# Patient Record
Sex: Female | Born: 1990 | Race: White | Hispanic: No | Marital: Married | Smoking: Never smoker
Health system: Southern US, Community
[De-identification: ages and names within clinical notes are randomized; demographics above are authoritative.]

## PROBLEM LIST (undated history)

## (undated) ENCOUNTER — Inpatient Hospital Stay (HOSPITAL_COMMUNITY): Payer: Self-pay

## (undated) DIAGNOSIS — I10 Essential (primary) hypertension: Secondary | ICD-10-CM

## (undated) DIAGNOSIS — Z789 Other specified health status: Secondary | ICD-10-CM

## (undated) HISTORY — PX: HIP SURGERY: SHX245

---
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
## 2008-06-21 ENCOUNTER — Emergency Department (HOSPITAL_COMMUNITY): Admission: EM | Admit: 2008-06-21 | Discharge: 2008-06-21 | Payer: Self-pay | Admitting: Emergency Medicine

## 2008-08-21 ENCOUNTER — Emergency Department (HOSPITAL_COMMUNITY): Admission: EM | Admit: 2008-08-21 | Discharge: 2008-08-21 | Payer: Self-pay | Admitting: Emergency Medicine

## 2010-04-11 ENCOUNTER — Inpatient Hospital Stay (HOSPITAL_COMMUNITY)
Admission: AD | Admit: 2010-04-11 | Discharge: 2010-04-11 | Payer: Self-pay | Source: Home / Self Care | Attending: Obstetrics and Gynecology | Admitting: Obstetrics and Gynecology

## 2010-04-11 LAB — URINALYSIS, ROUTINE W REFLEX MICROSCOPIC
Bilirubin Urine: NEGATIVE
Hgb urine dipstick: NEGATIVE
Ketones, ur: NEGATIVE mg/dL
Nitrite: NEGATIVE
Protein, ur: NEGATIVE mg/dL
Specific Gravity, Urine: 1.02 (ref 1.005–1.030)
Urine Glucose, Fasting: NEGATIVE mg/dL
Urobilinogen, UA: 0.2 mg/dL (ref 0.0–1.0)
pH: 6.5 (ref 5.0–8.0)

## 2010-04-11 LAB — WET PREP, GENITAL
Clue Cells Wet Prep HPF POC: NONE SEEN
Trich, Wet Prep: NONE SEEN
Yeast Wet Prep HPF POC: NONE SEEN

## 2010-04-11 LAB — URINE MICROSCOPIC-ADD ON

## 2010-04-12 LAB — GC/CHLAMYDIA PROBE AMP, GENITAL
Chlamydia, DNA Probe: NEGATIVE
GC Probe Amp, Genital: NEGATIVE

## 2010-04-13 LAB — URINE CULTURE
Colony Count: 35000
Culture  Setup Time: 201201291151

## 2010-04-27 ENCOUNTER — Inpatient Hospital Stay (HOSPITAL_COMMUNITY)
Admission: AD | Admit: 2010-04-27 | Discharge: 2010-04-27 | Disposition: A | Discharge status: Home / Self Care | Payer: Medicaid Other | Source: Ambulatory Visit | Attending: Obstetrics and Gynecology | Admitting: Obstetrics and Gynecology

## 2010-04-27 DIAGNOSIS — O368190 Decreased fetal movements, unspecified trimester, not applicable or unspecified: Secondary | ICD-10-CM | POA: Insufficient documentation

## 2010-04-27 LAB — URINALYSIS, ROUTINE W REFLEX MICROSCOPIC
Nitrite: NEGATIVE
Protein, ur: NEGATIVE mg/dL
Urine Glucose, Fasting: NEGATIVE mg/dL
Urobilinogen, UA: 0.2 mg/dL (ref 0.0–1.0)

## 2010-04-27 LAB — URINE MICROSCOPIC-ADD ON

## 2010-05-12 ENCOUNTER — Inpatient Hospital Stay (HOSPITAL_COMMUNITY)
Admission: AD | Admit: 2010-05-12 | Discharge: 2010-05-20 | DRG: 774 | Disposition: A | Discharge status: Home / Self Care | Payer: Medicaid Other | Source: Ambulatory Visit | Attending: Obstetrics and Gynecology | Admitting: Obstetrics and Gynecology

## 2010-05-12 DIAGNOSIS — O1414 Severe pre-eclampsia complicating childbirth: Principal | ICD-10-CM | POA: Diagnosis present

## 2010-05-12 LAB — URIC ACID: Uric Acid, Serum: 6.5 mg/dL (ref 2.4–7.0)

## 2010-05-12 LAB — COMPREHENSIVE METABOLIC PANEL
BUN: 11 mg/dL (ref 6–23)
CO2: 20 mEq/L (ref 19–32)
Calcium: 8.6 mg/dL (ref 8.4–10.5)
GFR calc non Af Amer: >60 mL/min (ref >60)
Glucose, Bld: 75 mg/dL (ref 70–99)
Total Protein: 5.7 g/dL — ABNORMAL LOW (ref 6.0–8.3)

## 2010-05-12 LAB — CBC
HCT: 39.7 % (ref 36.0–46.0)
MCH: 29.3 pg (ref 26.0–34.0)
MCHC: 34 g/dL (ref 30.0–36.0)
MCV: 86.3 fL (ref 78.0–100.0)
RDW: 13.2 % (ref 11.5–15.5)

## 2010-05-12 LAB — URINALYSIS, ROUTINE W REFLEX MICROSCOPIC
Nitrite: NEGATIVE
Protein, ur: >300 mg/dL — AB
Specific Gravity, Urine: >1.03 — ABNORMAL HIGH (ref 1.005–1.030)
Urobilinogen, UA: 1 mg/dL (ref 0.0–1.0)

## 2010-05-12 LAB — LACTATE DEHYDROGENASE: LDH: 201 U/L (ref 94–250)

## 2010-05-12 LAB — URINE MICROSCOPIC-ADD ON

## 2010-05-13 ENCOUNTER — Inpatient Hospital Stay (HOSPITAL_COMMUNITY): Payer: Medicaid Other

## 2010-05-13 LAB — COMPREHENSIVE METABOLIC PANEL
AST: 24 U/L (ref 0–37)
Albumin: 1.9 g/dL — ABNORMAL LOW (ref 3.5–5.2)
Calcium: 8.8 mg/dL (ref 8.4–10.5)
Creatinine, Ser: 0.87 mg/dL (ref 0.4–1.2)
GFR calc Af Amer: >60 mL/min (ref >60)

## 2010-05-13 LAB — CREATININE CLEARANCE, URINE, 24 HOUR
Collection Interval-CRCL: 24 hours
Creatinine Clearance: 66 mL/min — ABNORMAL LOW (ref 75–115)
Creatinine, 24H Ur: 821 mg/d (ref 700–1800)
Creatinine, Urine: 364.8 mg/dL
Creatinine: 0.87 mg/dL (ref 0.4–1.2)

## 2010-05-13 LAB — CBC
HCT: 36.7 % (ref 36.0–46.0)
Hemoglobin: 12.2 g/dL (ref 12.0–15.0)
MCHC: 33.2 g/dL (ref 30.0–36.0)
MCV: 86.6 fL (ref 78.0–100.0)
Platelets: 153 10*3/uL (ref 150–400)
WBC: 15 10*3/uL — ABNORMAL HIGH (ref 4.0–10.5)

## 2010-05-14 LAB — CBC
HCT: 36.6 % (ref 36.0–46.0)
Hemoglobin: 12.4 g/dL (ref 12.0–15.0)
Hemoglobin: 12.6 g/dL (ref 12.0–15.0)
MCH: 29.2 pg (ref 26.0–34.0)
MCHC: 33.9 g/dL (ref 30.0–36.0)
MCHC: 33.3 g/dL (ref 30.0–36.0)
Platelets: 170 10*3/uL (ref 150–400)
RDW: 13.3 % (ref 11.5–15.5)

## 2010-05-14 LAB — COMPREHENSIVE METABOLIC PANEL
ALT: 16 U/L (ref 0–35)
ALT: 19 U/L (ref 0–35)
AST: 31 U/L (ref 0–37)
Albumin: 2 g/dL — ABNORMAL LOW (ref 3.5–5.2)
CO2: 20 mEq/L (ref 19–32)
CO2: 19 mEq/L (ref 19–32)
Calcium: 8.8 mg/dL (ref 8.4–10.5)
Calcium: 8.2 mg/dL — ABNORMAL LOW (ref 8.4–10.5)
Creatinine, Ser: 0.91 mg/dL (ref 0.4–1.2)
GFR calc Af Amer: >60 mL/min (ref >60)
GFR calc non Af Amer: >60 mL/min (ref >60)
Glucose, Bld: 132 mg/dL — ABNORMAL HIGH (ref 70–99)
Sodium: 136 mEq/L (ref 135–145)
Sodium: 137 mEq/L (ref 135–145)
Total Protein: 4.8 g/dL — ABNORMAL LOW (ref 6.0–8.3)

## 2010-05-14 LAB — URIC ACID: Uric Acid, Serum: 7.3 mg/dL — ABNORMAL HIGH (ref 2.4–7.0)

## 2010-05-15 ENCOUNTER — Inpatient Hospital Stay (HOSPITAL_COMMUNITY): Payer: Medicaid Other

## 2010-05-15 ENCOUNTER — Encounter (HOSPITAL_COMMUNITY): Payer: Self-pay | Admitting: Radiology

## 2010-05-15 LAB — CBC
HCT: 35.5 % — ABNORMAL LOW (ref 36.0–46.0)
HCT: 35.5 % — ABNORMAL LOW (ref 36.0–46.0)
MCH: 28.5 pg (ref 26.0–34.0)
MCHC: 32.7 g/dL (ref 30.0–36.0)
MCV: 87.2 fL (ref 78.0–100.0)
MCV: 86.8 fL (ref 78.0–100.0)
Platelets: 140 10*3/uL — ABNORMAL LOW (ref 150–400)
RBC: 4.09 MIL/uL (ref 3.87–5.11)
RDW: 13.5 % (ref 11.5–15.5)
WBC: 18 10*3/uL — ABNORMAL HIGH (ref 4.0–10.5)

## 2010-05-15 LAB — COMPREHENSIVE METABOLIC PANEL
BUN: 17 mg/dL (ref 6–23)
CO2: 22 mEq/L (ref 19–32)
Calcium: 7.8 mg/dL — ABNORMAL LOW (ref 8.4–10.5)
GFR calc non Af Amer: >60 mL/min (ref >60)
Glucose, Bld: 90 mg/dL (ref 70–99)
Total Protein: 4.5 g/dL — ABNORMAL LOW (ref 6.0–8.3)

## 2010-05-16 LAB — COMPREHENSIVE METABOLIC PANEL
ALT: 17 U/L (ref 0–35)
AST: 25 U/L (ref 0–37)
Albumin: 1.6 g/dL — ABNORMAL LOW (ref 3.5–5.2)
Alkaline Phosphatase: 97 U/L (ref 39–117)
Calcium: 7.9 mg/dL — ABNORMAL LOW (ref 8.4–10.5)
GFR calc Af Amer: >60 mL/min (ref >60)
Potassium: 4.2 mEq/L (ref 3.5–5.1)
Sodium: 137 mEq/L (ref 135–145)
Total Protein: 4.2 g/dL — ABNORMAL LOW (ref 6.0–8.3)

## 2010-05-16 LAB — TYPE AND SCREEN: Weak D: POSITIVE

## 2010-05-16 LAB — CBC
HCT: 36.3 % (ref 36.0–46.0)
Hemoglobin: 12.2 g/dL (ref 12.0–15.0)
MCH: 28.6 pg (ref 26.0–34.0)
MCHC: 33.6 g/dL (ref 30.0–36.0)
RBC: 4.27 MIL/uL (ref 3.87–5.11)

## 2010-05-17 LAB — CBC
HCT: 37.4 % (ref 36.0–46.0)
HCT: 39.1 % (ref 36.0–46.0)
HCT: 41.5 % (ref 36.0–46.0)
Hemoglobin: 12.4 g/dL (ref 12.0–15.0)
MCHC: 33.2 g/dL (ref 30.0–36.0)
MCHC: 33.2 g/dL (ref 30.0–36.0)
MCV: 86.8 fL (ref 78.0–100.0)
Platelets: 162 10*3/uL (ref 150–400)
RBC: 4.31 MIL/uL (ref 3.87–5.11)
RBC: 4.78 MIL/uL (ref 3.87–5.11)
RDW: 13.4 % (ref 11.5–15.5)
WBC: 13.8 10*3/uL — ABNORMAL HIGH (ref 4.0–10.5)
WBC: 17.9 10*3/uL — ABNORMAL HIGH (ref 4.0–10.5)

## 2010-05-17 LAB — COMPREHENSIVE METABOLIC PANEL
ALT: 15 U/L (ref 0–35)
ALT: 15 U/L (ref 0–35)
AST: 22 U/L (ref 0–37)
Albumin: 1.8 g/dL — ABNORMAL LOW (ref 3.5–5.2)
Alkaline Phosphatase: 91 U/L (ref 39–117)
Alkaline Phosphatase: 108 U/L (ref 39–117)
BUN: 14 mg/dL (ref 6–23)
BUN: 12 mg/dL (ref 6–23)
CO2: 22 mEq/L (ref 19–32)
Calcium: 8 mg/dL — ABNORMAL LOW (ref 8.4–10.5)
Calcium: 8.2 mg/dL — ABNORMAL LOW (ref 8.4–10.5)
Chloride: 111 mEq/L (ref 96–112)
Chloride: 102 mEq/L (ref 96–112)
GFR calc Af Amer: >60 mL/min (ref >60)
GFR calc non Af Amer: >60 mL/min (ref >60)
GFR calc non Af Amer: >60 mL/min (ref >60)
Glucose, Bld: 88 mg/dL (ref 70–99)
Glucose, Bld: 99 mg/dL (ref 70–99)
Potassium: 4.4 mEq/L (ref 3.5–5.1)
Potassium: 4.6 mEq/L (ref 3.5–5.1)
Sodium: 139 mEq/L (ref 135–145)
Sodium: 137 mEq/L (ref 135–145)
Total Bilirubin: 0.6 mg/dL (ref 0.3–1.2)
Total Protein: 5.1 g/dL — ABNORMAL LOW (ref 6.0–8.3)

## 2010-05-18 ENCOUNTER — Other Ambulatory Visit: Payer: Self-pay | Admitting: Obstetrics and Gynecology

## 2010-05-18 LAB — COMPREHENSIVE METABOLIC PANEL
AST: 31 U/L (ref 0–37)
Albumin: 1.2 g/dL — ABNORMAL LOW (ref 3.5–5.2)
Chloride: 105 mEq/L (ref 96–112)
Creatinine, Ser: 0.91 mg/dL (ref 0.4–1.2)
GFR calc Af Amer: >60 mL/min (ref >60)
Potassium: 4.5 mEq/L (ref 3.5–5.1)
Total Bilirubin: 0.2 mg/dL — ABNORMAL LOW (ref 0.3–1.2)

## 2010-05-18 LAB — CBC
MCH: 28.2 pg (ref 26.0–34.0)
MCH: 29.2 pg (ref 26.0–34.0)
MCV: 86.5 fL (ref 78.0–100.0)
Platelets: 169 10*3/uL (ref 150–400)
Platelets: 170 10*3/uL (ref 150–400)
RBC: 2.94 MIL/uL — ABNORMAL LOW (ref 3.87–5.11)
RBC: 2.67 MIL/uL — ABNORMAL LOW (ref 3.87–5.11)
WBC: 27.6 10*3/uL — ABNORMAL HIGH (ref 4.0–10.5)

## 2010-05-19 LAB — COMPREHENSIVE METABOLIC PANEL
BUN: 17 mg/dL (ref 6–23)
CO2: 23 mEq/L (ref 19–32)
Calcium: 6.7 mg/dL — ABNORMAL LOW (ref 8.4–10.5)
Creatinine, Ser: 0.95 mg/dL (ref 0.4–1.2)
GFR calc Af Amer: >60 mL/min (ref >60)
GFR calc non Af Amer: >60 mL/min (ref >60)
Glucose, Bld: 98 mg/dL (ref 70–99)

## 2010-05-19 LAB — CBC
HCT: 19.9 % — ABNORMAL LOW (ref 36.0–46.0)
Hemoglobin: 6.6 g/dL — CL (ref 12.0–15.0)
MCH: 28.6 pg (ref 26.0–34.0)
MCHC: 33.2 g/dL (ref 30.0–36.0)
MCV: 86.1 fL (ref 78.0–100.0)

## 2010-05-19 LAB — MAGNESIUM: Magnesium: 6.7 mg/dL (ref 1.5–2.5)

## 2010-05-20 LAB — DIFFERENTIAL
Basophils Absolute: 0.1 10*3/uL (ref 0.0–0.1)
Basophils Relative: 0 % (ref 0–1)
Lymphocytes Relative: 11 % — ABNORMAL LOW (ref 12–46)
Neutro Abs: 15.8 10*3/uL — ABNORMAL HIGH (ref 1.7–7.7)
Neutrophils Relative %: 80 % — ABNORMAL HIGH (ref 43–77)

## 2010-05-20 LAB — CBC
HCT: 21.9 % — ABNORMAL LOW (ref 36.0–46.0)
Hemoglobin: 7 g/dL — ABNORMAL LOW (ref 12.0–15.0)
RDW: 14.5 % (ref 11.5–15.5)
WBC: 19.7 10*3/uL — ABNORMAL HIGH (ref 4.0–10.5)

## 2010-05-25 NOTE — Discharge Summary (Signed)
Misty Zuniga, Misty Zuniga              ACCOUNT NO.:  [id number]  MEDICAL RECORD NO.:  [id number]           PATIENT TYPE:  I  LOCATION:  9319                          FACILITY:  [facility]  PHYSICIAN:  Sherron Monday, MD        DATE OF BIRTH:  22-Mar-1990  DATE OF ADMISSION:  05/12/2010 DATE OF DISCHARGE:  05/20/2010                              DISCHARGE SUMMARY   ADMITTING DIAGNOSIS:  Intrauterine pregnancy at 31 plus weeks with elevated blood pressure and proteinuria.  DISCHARGE DIAGNOSIS:  Status post vaginal delivery and severe preeclampsia.  HISTORY OF PRESENT ILLNESS:  This is a 20 year old, G1, P0 at 22 and 5 with an EDC on July 08, 2010 by LMP consistent with an 8-week ultrasound presented from the office with elevated blood pressures 150s/110 and 3+ proteinuria for further evaluation.  She denies headaches, some nausea and vomiting.  She admits pitting edema. Prenatal care has been otherwise uneventful.  PAST MEDICAL HISTORY:  Significant for ADHD.  PAST SURGICAL HISTORY:  Significant for hip repair in 2002.  PAST OB/GYN HISTORY:  G1 with the present pregnancy.  MEDICATIONS:  Prenatal vitamins.  ALLERGIES:  No known drug allergies.  SOCIAL HISTORY:  Denies alcohol, tobacco, or drug use.  FAMILY HISTORY:  Per her records.  PRENATAL LABORATORY DATA:  O positive.  Antibody screen negative. Rubella equivocal.  RPR nonreactive.  Hepatitis B surface antigen negative.  HIV negative.  Gonorrhea negative.  Chlamydia negative.  Quad screen within normal limits.  Cystic fibrosis screen normal.  Glucola 141, 3-hour of 79, 175, 145, and 96.  On admission, her pressures were elevated, otherwise benign exam.  Had a reactive NST, received steroids, started on 24-hour urine and close observation.  Her platelets were within normal limits as were her LFTs.  Initially, she was started on labetalol, this was discontinued as her blood pressures with bed rest had resolved some on their own.  She  continued through the weekend with mild symptoms and close monitoring of these symptoms.  On the morning of Monday, May 17, 2010, she had a mild-to-moderate headache and had been treated for life.  She also complained of scotomata.  She was transferred to L and D for induction of labor.  She progressed in labor with receiving magnesium for seizure prophylaxis.  AROM was performed. NICU was aware of her labor process.  She had received an ultrasound which revealed an estimated fetal weight of 4 pounds 12 ounces.  She progressed, anterior lip complete, +2 to 3, pushed for 5-10 minutes at the cervical lip and then pushed for an hour to deliver a viable female infant at 12:38 on the morning of May 18, 2010 with Apgars of 8 at 1 minute and 9 at 5 minutes with a weight of 4 pounds and 12 ounces.  The patient's vulva was very swollen and blisters from edema noted. Placenta was expressed intact, sent to pathology.  She had a secondary perineal laceration, this was repaired with 2-0 Vicryl to reinforce the rectal sphincter and 3-0 Vicryl Rapide.  EBL was less than 500 mL. Infant delivered through the nuchal cord.  Her postpartum course was relatively uncomplicated.  She was maintained on magnesium for approximately 24 hours.  Her labs were also closely monitored.  She was again treated for life.  Her hemoglobin was noted to be stable on postpartum day #2 at 7 and started at 14 and was stable following delivery and being followed every day.  On the day of discharge, she was ambulating, voiding, and tolerating a diet.  Her pain was well controlled.  She had normal lochia.  Discharge instructions were reviewed with her including reasons to call with any questions or problems.  She was given prescriptions for Motrin, Vicodin, prenatal vitamins, and iron.  She will follow up in the office in approximately a week for a blood pressure check.  DISCHARGE INFORMATION:  She is B+, rubella equivocal,  received RPR and MMR prior to discharge.  She plans both breast and bottle feed.  An IUD will be placed.  Hemoglobin decreased from 14 to 8.3 to 7.8 to 6.6 to 7.     Sherron Monday, MD     JB/MEDQ  D:  05/20/2010  T:  05/20/2010  Job:  [id number]  Electronically Signed by Sherron Monday MD on 05/25/2010 01:49:28 PM

## 2010-06-21 LAB — RAPID STREP SCREEN (MED CTR MEBANE ONLY): Streptococcus, Group A Screen (Direct): POSITIVE — AB

## 2010-08-24 ENCOUNTER — Emergency Department (HOSPITAL_COMMUNITY)
Admission: EM | Admit: 2010-08-24 | Discharge: 2010-08-24 | Disposition: A | Discharge status: Home / Self Care | Payer: Medicaid Other | Attending: Emergency Medicine | Admitting: Emergency Medicine

## 2010-08-24 ENCOUNTER — Emergency Department (HOSPITAL_COMMUNITY): Payer: Medicaid Other

## 2010-08-24 DIAGNOSIS — F988 Other specified behavioral and emotional disorders with onset usually occurring in childhood and adolescence: Secondary | ICD-10-CM | POA: Insufficient documentation

## 2010-08-24 DIAGNOSIS — M79609 Pain in unspecified limb: Secondary | ICD-10-CM | POA: Insufficient documentation

## 2010-08-24 DIAGNOSIS — M25559 Pain in unspecified hip: Secondary | ICD-10-CM | POA: Insufficient documentation

## 2010-08-24 LAB — POCT PREGNANCY, URINE: Preg Test, Ur: NEGATIVE

## 2010-09-19 ENCOUNTER — Emergency Department (HOSPITAL_COMMUNITY)
Admission: EM | Admit: 2010-09-19 | Discharge: 2010-09-19 | Disposition: A | Discharge status: Home / Self Care | Payer: Medicaid Other | Attending: Emergency Medicine | Admitting: Emergency Medicine

## 2010-09-19 DIAGNOSIS — F988 Other specified behavioral and emotional disorders with onset usually occurring in childhood and adolescence: Secondary | ICD-10-CM | POA: Insufficient documentation

## 2010-09-19 DIAGNOSIS — M25559 Pain in unspecified hip: Secondary | ICD-10-CM | POA: Insufficient documentation

## 2013-02-20 LAB — OB RESULTS CONSOLE GC/CHLAMYDIA
Chlamydia: NEGATIVE
Gonorrhea: NEGATIVE

## 2013-02-20 LAB — OB RESULTS CONSOLE RUBELLA ANTIBODY, IGM: Rubella: NON-IMMUNE/NOT IMMUNE

## 2013-02-20 LAB — OB RESULTS CONSOLE ABO/RH: RH TYPE: POSITIVE

## 2013-02-20 LAB — OB RESULTS CONSOLE ANTIBODY SCREEN: Antibody Screen: NEGATIVE

## 2013-02-20 LAB — OB RESULTS CONSOLE RPR: RPR: NONREACTIVE

## 2013-02-20 LAB — OB RESULTS CONSOLE HIV ANTIBODY (ROUTINE TESTING): HIV: NONREACTIVE

## 2013-02-20 LAB — OB RESULTS CONSOLE HEPATITIS B SURFACE ANTIGEN: Hepatitis B Surface Ag: NEGATIVE

## 2013-03-14 NOTE — L&D Delivery Note (Signed)
Delivery Note At 8:36 AM a viable female was delivered via  (Presentation: OA ).  APGAR: P; weight 3 lb (1361 g).   Placenta status: delivered, manually extracted.  Cord:  3VC with the following complications: nuchal.    Anesthesia:  epidural Episiotomy: none Lacerations: none Suture Repair: N/A Est. Blood Loss (mL): 400  Mom to postpartum.  Baby to NICU.  Misty Zuniga 07/15/2013, 9:04 AM  Br/B+/IUD PP/RNI  Pt declines circ for female baby

## 2013-06-09 ENCOUNTER — Encounter (HOSPITAL_COMMUNITY): Payer: Self-pay | Admitting: *Deleted

## 2013-06-09 ENCOUNTER — Inpatient Hospital Stay (HOSPITAL_COMMUNITY): Payer: PRIVATE HEALTH INSURANCE

## 2013-06-09 ENCOUNTER — Inpatient Hospital Stay (HOSPITAL_COMMUNITY)
Admission: AD | Admit: 2013-06-09 | Discharge: 2013-06-09 | Disposition: A | Discharge status: Home / Self Care | Payer: PRIVATE HEALTH INSURANCE | Source: Ambulatory Visit | Attending: Obstetrics and Gynecology | Admitting: Obstetrics and Gynecology

## 2013-06-09 DIAGNOSIS — O4700 False labor before 37 completed weeks of gestation, unspecified trimester: Secondary | ICD-10-CM | POA: Insufficient documentation

## 2013-06-09 DIAGNOSIS — R109 Unspecified abdominal pain: Secondary | ICD-10-CM | POA: Insufficient documentation

## 2013-06-09 HISTORY — DX: Other specified health status: Z78.9

## 2013-06-09 LAB — URINALYSIS, ROUTINE W REFLEX MICROSCOPIC
BILIRUBIN URINE: NEGATIVE
GLUCOSE, UA: NEGATIVE mg/dL
Ketones, ur: NEGATIVE mg/dL
Nitrite: NEGATIVE
PROTEIN: 30 mg/dL — AB
Specific Gravity, Urine: 1.025 (ref 1.005–1.030)
Urobilinogen, UA: 1 mg/dL (ref 0.0–1.0)
pH: 7.5 (ref 5.0–8.0)

## 2013-06-09 LAB — WET PREP, GENITAL
Clue Cells Wet Prep HPF POC: NONE SEEN
Trich, Wet Prep: NONE SEEN
Yeast Wet Prep HPF POC: NONE SEEN

## 2013-06-09 LAB — URINE MICROSCOPIC-ADD ON

## 2013-06-09 LAB — FETAL FIBRONECTIN: FETAL FIBRONECTIN: NEGATIVE

## 2013-06-09 MED ORDER — NIFEDIPINE 10 MG PO CAPS: 10.0000 mg | ORAL_CAPSULE | ORAL | Status: DC | PRN

## 2013-06-09 MED ORDER — NIFEDIPINE 10 MG PO CAPS
10.0000 mg | ORAL_CAPSULE | Freq: Once | ORAL | Status: AC
  Administered 2013-06-09: 10 mg via ORAL
  Filled 2013-06-09: qty 1

## 2013-06-09 NOTE — MAU Note (Signed)
23 yo, G2P1 at 5144w0d, presents to MAU with c/o intermittent, irregular contractions and cramping since last night; her son's birthday party was yesterday and she reports she was on her feet for several hours.  Reports +FM; denies VB, LOF.  History significant for preterm delivery at 32 weeks.

## 2013-06-09 NOTE — MAU Note (Signed)
Pt presents with complaints of lower abdominal cramping that started last night, she was able to go to sleep but cramping started back this morning. Denies any vaginal bleeding or LOF.

## 2013-06-09 NOTE — Progress Notes (Signed)
Patient denies vaginal bleeding.

## 2013-06-09 NOTE — Discharge Instructions (Signed)
Get your prescription from the pharmacy.  Take as directed. Follow up in the office in one week. Drink at least 8 8-oz glasses of water every day. Call your doctor if you continue to have contractions even after taking the procardia or if you are leaking or having vaginal bleeding. Pelvic rest - no sex, No tampons, no douching.

## 2013-06-09 NOTE — MAU Note (Signed)
Pt had preterm labor with first pregnancy and had baby at [redacted]weeks gestation

## 2013-06-09 NOTE — MAU Provider Note (Signed)
History     CSN: [id number]  Arrival date and time: 06/09/13 1430   First Provider Initiated Contact with Patient 06/09/13 1521      Chief Complaint  Patient presents with  . Abdominal Cramping   HPI Misty Zuniga 23 y.o. [redacted]w[redacted]d   Came in for uterine contractions.  Has history of preterm contractions at 27 weeks with last pregnancy.  Last pregnancy was a preterm vaginal delivery at 32 weeks due to preeclampsia.  Denies any leaking or bleeding.    OB History   Grav Para Term Preterm Abortions TAB SAB Ect Mult Living   1               Past Medical History  Diagnosis Date  . Medical history non-contributory     Past Surgical History  Procedure Laterality Date  . Hip surgery      legg calve perthes corrective surgery (L hip)    History reviewed. No pertinent family history.  History  Substance Use Topics  . Smoking status: Never Smoker   . Smokeless tobacco: Not on file  . Alcohol Use: No    Allergies: No Known Allergies  No prescriptions prior to admission    Review of Systems  Constitutional: Negative for fever.  Gastrointestinal: Positive for abdominal pain. Negative for nausea, vomiting, diarrhea and constipation.  Genitourinary:       No vaginal discharge. No vaginal bleeding. No dysuria.   Physical Exam   Blood pressure 141/80, pulse 99, temperature 98.4 F (36.9 C), temperature source Oral, resp. rate 18, height 4\' 10"  (1.473 m), weight 176 lb (79.833 kg), last menstrual period 11/25/2012.  Physical Exam  Nursing note and vitals reviewed. Constitutional: She is oriented to person, place, and time. She appears well-developed and well-nourished.  HENT:  Head: Normocephalic.  Eyes: EOM are normal.  Neck: Neck supple.  GI: Soft. There is no tenderness.  FHT baseline 150.  A few contractions seen at 4-6 minutes.  One Variable deceleration seen.    Genitourinary:  Speculum exam: Vagina - No blood seen on introduction of speculum into vagina.   Difficult to fully visualize cervix.  FFN done and bleeding noted Bimanual exam: Cervix closed and very soft GC/Chlam, wet prep done Chaperone present for exam.  Musculoskeletal: Normal range of motion.  Neurological: She is alert and oriented to person, place, and time.  Skin: Skin is warm and dry.  Psychiatric: She has a normal mood and affect.    MAU Course  Procedures Results for orders placed during the hospital encounter of 06/09/13 (from the past 24 hour(s))  URINALYSIS, ROUTINE W REFLEX MICROSCOPIC     Status: Abnormal   Collection Time    06/09/13  2:48 PM      Result Value Ref Range   Color, Urine YELLOW  YELLOW   APPearance CLOUDY (*) CLEAR   Specific Gravity, Urine 1.025  1.005 - 1.030   pH 7.5  5.0 - 8.0   Glucose, UA NEGATIVE  NEGATIVE mg/dL   Hgb urine dipstick SMALL (*) NEGATIVE   Bilirubin Urine NEGATIVE  NEGATIVE   Ketones, ur NEGATIVE  NEGATIVE mg/dL   Protein, ur 30 (*) NEGATIVE mg/dL   Urobilinogen, UA 1.0  0.0 - 1.0 mg/dL   Nitrite NEGATIVE  NEGATIVE   Leukocytes, UA TRACE (*) NEGATIVE  URINE MICROSCOPIC-ADD ON     Status: Abnormal   Collection Time    06/09/13  2:48 PM      Result Value Ref Range  Squamous Epithelial / LPF MANY (*) RARE   WBC, UA 0-2  <3 WBC/hpf   RBC / HPF 7-10  <3 RBC/hpf   Bacteria, UA FEW (*) RARE   Crystals CA OXALATE CRYSTALS (*) NEGATIVE   Urine-Other MUCOUS PRESENT    WET PREP, GENITAL     Status: Abnormal   Collection Time    06/09/13  3:42 PM      Result Value Ref Range   Yeast Wet Prep HPF POC NONE SEEN  NONE SEEN   Trich, Wet Prep NONE SEEN  NONE SEEN   Clue Cells Wet Prep HPF POC NONE SEEN  NONE SEEN   WBC, Wet Prep HPF POC TOO NUMEROUS TO COUNT (*) NONE SEEN  FETAL FIBRONECTIN     Status: None   Collection Time    06/09/13  3:42 PM      Result Value Ref Range   Fetal Fibronectin NEGATIVE  NEGATIVE   MDM Ultrasound reviewed - Cervical length 2.6 cm. Dr. Senaida Oresichardson notified of US results and orders  received.  Assessment and Plan  Threatened preterm labor  Plan Procardia 30 mg PO in MAU Rx Procardia 30 mg PO a 4 hours PRN for contractions (#30) no refills Follow up in the office in one week. Drink at least 8 8-oz glasses of water every day. Call your doctor if you continue to have contractions even after taking the procardia or if you are leaking or having vaginal bleeding. Pelvic rest - no sex, No tampons, no douching. Urine culture pending.  Ember Gottwald 06/09/2013, 3:23 PM

## 2013-06-10 LAB — GC/CHLAMYDIA PROBE AMP
CT PROBE, AMP APTIMA: NEGATIVE
GC Probe RNA: NEGATIVE

## 2013-06-11 ENCOUNTER — Inpatient Hospital Stay (HOSPITAL_COMMUNITY)
Admission: AD | Admit: 2013-06-11 | Discharge: 2013-06-11 | Disposition: A | Discharge status: Home / Self Care | Payer: PRIVATE HEALTH INSURANCE | Source: Ambulatory Visit | Attending: Obstetrics and Gynecology | Admitting: Obstetrics and Gynecology

## 2013-06-11 ENCOUNTER — Encounter (HOSPITAL_COMMUNITY): Payer: Self-pay | Admitting: *Deleted

## 2013-06-11 DIAGNOSIS — O4700 False labor before 37 completed weeks of gestation, unspecified trimester: Secondary | ICD-10-CM | POA: Insufficient documentation

## 2013-06-11 DIAGNOSIS — O479 False labor, unspecified: Secondary | ICD-10-CM

## 2013-06-11 LAB — URINE MICROSCOPIC-ADD ON

## 2013-06-11 LAB — URINALYSIS, ROUTINE W REFLEX MICROSCOPIC
Bilirubin Urine: NEGATIVE
Glucose, UA: NEGATIVE mg/dL
Ketones, ur: 40 mg/dL — AB
LEUKOCYTES UA: NEGATIVE
NITRITE: NEGATIVE
PROTEIN: NEGATIVE mg/dL
Specific Gravity, Urine: 1.01 (ref 1.005–1.030)
Urobilinogen, UA: 0.2 mg/dL (ref 0.0–1.0)
pH: 7.5 (ref 5.0–8.0)

## 2013-06-11 NOTE — MAU Provider Note (Signed)
History     CSN: [id number]  Arrival date and time: 06/11/13 [id number]   First Provider Initiated Contact with Patient 06/11/13 2022      Chief Complaint  Patient presents with  . Abdominal Cramping   HPI [name] is a 23 y.o. G2P0101 at 7244w2d who presents to MAU today with complaint of contractions. The patient was seen here on Sunday with contractions and started on Procardia PRN. She states that she noted contractions again q 5-10 minutes this evening. She took her Procardia and Progesterone at 1700 with minimal relief. She states that since arrival in MAU her contractions have become irregular. She noted moderate discomfort with contractions. She is also describing an increased in pelvic pressure. She denies vaginal bleeding, discharge or LOF. She reports good fetal movment. Cervix was closed, thick and soft on Sunday and FFN was negative then as well.   OB History   Grav Para Term Preterm Abortions TAB SAB Ect Mult Living   2 1  1      1       Past Medical History  Diagnosis Date  . Medical history non-contributory     Past Surgical History  Procedure Laterality Date  . 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 Diabetes Mother   . Diabetes Maternal Aunt     History  Substance Use Topics  . Smoking status: Never Smoker   . Smokeless tobacco: Not on file  . Alcohol Use: No    Allergies: No Known Allergies  Prescriptions prior to admission  Medication Sig Dispense Refill  . acetaminophen (TYLENOL) 325 MG tablet Take 650 mg by mouth every 6 (six) hours as needed for headache.      Marland Kitchen. aspirin 81 MG chewable tablet Chew 81 mg by mouth daily.      Marland Kitchen. NIFEdipine (PROCARDIA) 10 MG capsule Take 1 capsule (10 mg total) by mouth every 4 (four) hours as needed.  30 capsule  0  . Prenatal Vit-Fe Fumarate-FA (PRENATAL MULTIVITAMIN) TABS tablet Take 1 tablet by mouth daily at 12 noon.      .  progesterone (PROMETRIUM) 200 MG capsule Take 200 mg by mouth at bedtime.        Review of Systems  Constitutional: Negative for fever and malaise/fatigue.  Gastrointestinal: Negative for nausea, vomiting, abdominal pain, diarrhea and constipation.  Genitourinary:       Neg - vaginal bleeding, discharge, LOF   Physical Exam   Blood pressure 111/74, pulse 97, temperature 98.6 F (37 C), resp. rate 18, last menstrual period 11/25/2012.  Physical Exam  Constitutional: She is oriented to person, place, and time. She appears well-developed and well-nourished. No distress.  HENT:  Head: Normocephalic and atraumatic.  Cardiovascular: Normal rate, regular rhythm and normal heart sounds.   Respiratory: Effort normal and breath sounds normal. No respiratory distress.  GI: Soft. Bowel sounds are normal. She exhibits no distension and no mass. There is no tenderness. There is no rebound and no guarding.  Neurological: She is alert and oriented to person, place, and time.  Skin: Skin is warm and dry. No erythema.  Psychiatric: She has a normal mood and affect.  Dilation: 1 (1cm outer os and FT inner os) Effacement (%): 50 Exam by:: Naaman PlummerJ Ethier PA  Results for orders placed during the hospital encounter of 06/11/13 (from the past 24 hour(s))  URINALYSIS, ROUTINE W  REFLEX MICROSCOPIC     Status: Abnormal   Collection Time    06/11/13  7:35 PM      Result Value Ref Range   Color, Urine YELLOW  YELLOW   APPearance CLEAR  CLEAR   Specific Gravity, Urine 1.010  1.005 - 1.030   pH 7.5  5.0 - 8.0   Glucose, UA NEGATIVE  NEGATIVE mg/dL   Hgb urine dipstick TRACE (*) NEGATIVE   Bilirubin Urine NEGATIVE  NEGATIVE   Ketones, ur 40 (*) NEGATIVE mg/dL   Protein, ur NEGATIVE  NEGATIVE mg/dL   Urobilinogen, UA 0.2  0.0 - 1.0 mg/dL   Nitrite NEGATIVE  NEGATIVE   Leukocytes, UA NEGATIVE  NEGATIVE  URINE MICROSCOPIC-ADD ON     Status: Abnormal   Collection Time    06/11/13  7:35 PM      Result Value Ref  Range   Squamous Epithelial / LPF RARE  RARE   WBC, UA 0-2  <3 WBC/hpf   Bacteria, UA FEW (*) RARE   Urine-Other MUCOUS PRESENT      Fetal Monitoring: Baseline:140 bpm, moderate variability, +accelerations, no decelerations Contractions: occasional, mild UI MAU Course  Procedures None  MDM Discussed with Dr. Ambrose Mantle. Ok for discharge. Continue on current tocolytic therapy. Follow-up in the office as scheduled.   Assessment and Plan  A: Preterm contractions  P: Discharge home Patient advised to continue on current tocolytic regimen Preterm labor precautions discussed Patient to follow-up in the office as scheduled or sooner if symptoms worsen Patient may return to MAU as needed or if her condition were to change or worsen  Freddi Starr, PA-C  06/11/2013, 8:23 PM

## 2013-06-11 NOTE — Discharge Instructions (Signed)
Braxton Hicks Contractions Pregnancy is commonly associated with contractions of the uterus throughout the pregnancy. Towards the end of pregnancy (32 to 34 weeks), these contractions [facility](Braxton Willa RoughHicks) can develop more often and may become more forceful. This is not true labor because these contractions do not result in opening (dilatation) and thinning of the cervix. They are sometimes difficult to tell apart from true labor because these contractions can be forceful and people have different pain tolerances. You should not feel embarrassed if you go to the hospital with false labor. Sometimes, the only way to tell if you are in true labor is for your caregiver to follow the changes in the cervix. How to tell the difference between true and false labor:  False labor.  The contractions of false labor are usually shorter, irregular and not as hard as those of true labor.  They are often felt in the front of the lower abdomen and in the groin.  They may leave with walking around or changing positions while lying down.  They get weaker and are shorter lasting as time goes on.  These contractions are usually irregular.  They do not usually become progressively stronger, regular and closer together as with true labor.  True labor.  Contractions in true labor last 30 to 70 seconds, become very regular, usually become more intense, and increase in frequency.  They do not go away with walking.  The discomfort is usually felt in the top of the uterus and spreads to the lower abdomen and low back.  True labor can be determined by your caregiver with an exam. This will show that the cervix is dilating and getting thinner. If there are no prenatal problems or other health problems associated with the pregnancy, it is completely safe to be sent home with false labor and await the onset of true labor. HOME CARE INSTRUCTIONS   Keep up with your usual exercises and instructions.  Take medications as  directed.  Keep your regular prenatal appointment.  Eat and drink lightly if you think you are going into labor.  If BH contractions are making you uncomfortable:  Change your activity position from lying down or resting to walking/walking to resting.  Sit and rest in a tub of warm water.  Drink 2 to 3 glasses of water. Dehydration may cause B-H contractions.  Do slow and deep breathing several times an hour. SEEK IMMEDIATE MEDICAL CARE IF:   Your contractions continue to become stronger, more regular, and closer together.  You have a gushing, burst or leaking of fluid from the vagina.  An oral temperature above 102 F (38.9 C) develops.  You have passage of blood-tinged mucus.  You develop vaginal bleeding.  You develop continuous belly (abdominal) pain.  You have low back pain that you never had before.  You feel the baby's head pushing down causing pelvic pressure.  The baby is not moving as much as it used to. Document Released: 02/28/2005 Document Revised: 05/23/2011 Document Reviewed: 12/10/2012 [facility] Patient Information 2014 [place], [place]. Preterm Labor Information Preterm labor is when labor starts before you are [redacted] weeks pregnant. The normal length of pregnancy is 39 to 41 weeks.  CAUSES  The cause of preterm labor is not often known. The most common known cause is infection. RISK FACTORS  Having a history of preterm labor.  Having your water break before it should.  Having a placenta that covers the opening of the cervix.  Having a placenta that breaks away from  the uterus.  Having a cervix that is too weak to hold the baby in the uterus.  Having too much fluid in the amniotic sac.  Taking drugs or smoking while pregnant.  Not gaining enough weight while pregnant.  Being younger than 5118 and older than 25 years old.  Having a low income.  Being African American. SYMPTOMS  Period-like cramps, belly (abdominal) pain, or back  pain.  Contractions that are regular, as often as six in an hour. They may be mild or painful.  Contractions that start at the top of the belly. They then move to the lower belly and back.  Lower belly pressure that seems to get stronger.  Bleeding from the vagina.  Fluid leaking from the vagina. TREATMENT  Treatment depends on:  Your condition.  The condition of your baby.  How many weeks pregnant you are. Your doctor may have you:  Take medicine to stop contractions.  Stay in bed except to use the restroom (bed rest).  Stay in the hospital. WHAT SHOULD YOU DO IF YOU THINK YOU ARE IN PRETERM LABOR? Call your doctor right away. You need to go to the hospital right away.  HOW CAN YOU PREVENT PRETERM LABOR IN FUTURE PREGNANCIES?  Stop smoking, if you smoke.  Maintain healthy weight gain.  Do not take drugs or be around chemicals that are not needed.  Tell your doctor if you think you have an infection.  Tell your doctor if you had a preterm labor before. Document Released: 05/27/2008 Document Revised: 12/19/2012 Document Reviewed: 05/27/2008 [facility] Patient Information 2014 [place], [place].

## 2013-06-11 NOTE — MAU Note (Signed)
C/o cramping since 1630 today; pt has preterm labor meds at home; seen this past Sun. in MAU:

## 2013-06-19 ENCOUNTER — Encounter (HOSPITAL_COMMUNITY): Payer: Self-pay | Admitting: *Deleted

## 2013-06-19 ENCOUNTER — Inpatient Hospital Stay (HOSPITAL_COMMUNITY)
Admission: AD | Admit: 2013-06-19 | Discharge: 2013-06-19 | Disposition: A | Discharge status: Home / Self Care | Payer: PRIVATE HEALTH INSURANCE | Source: Ambulatory Visit | Attending: Obstetrics and Gynecology | Admitting: Obstetrics and Gynecology

## 2013-06-19 DIAGNOSIS — O368190 Decreased fetal movements, unspecified trimester, not applicable or unspecified: Secondary | ICD-10-CM | POA: Insufficient documentation

## 2013-06-19 DIAGNOSIS — O99891 Other specified diseases and conditions complicating pregnancy: Secondary | ICD-10-CM | POA: Insufficient documentation

## 2013-06-19 DIAGNOSIS — R109 Unspecified abdominal pain: Secondary | ICD-10-CM | POA: Insufficient documentation

## 2013-06-19 DIAGNOSIS — O9989 Other specified diseases and conditions complicating pregnancy, childbirth and the puerperium: Secondary | ICD-10-CM

## 2013-06-19 LAB — URINALYSIS, ROUTINE W REFLEX MICROSCOPIC
Bilirubin Urine: NEGATIVE
GLUCOSE, UA: NEGATIVE mg/dL
KETONES UR: NEGATIVE mg/dL
Leukocytes, UA: NEGATIVE
Nitrite: NEGATIVE
PH: 6.5 (ref 5.0–8.0)
Protein, ur: NEGATIVE mg/dL
Urobilinogen, UA: 0.2 mg/dL (ref 0.0–1.0)

## 2013-06-19 LAB — URINE MICROSCOPIC-ADD ON: WBC UA: NONE SEEN WBC/hpf (ref <3)

## 2013-06-19 NOTE — MAU Note (Signed)
Pt reports only feeling baby move about 3 times today.  Reports lower abdominal pain and pressure. Denies VB/LOF.

## 2013-06-19 NOTE — MAU Provider Note (Signed)
  History     CSN: [id number]  Arrival date and time: 06/19/13 [id number]   First Provider Initiated Contact with Patient 06/19/13 [phone]      Chief Complaint  Patient presents with  . Decreased Fetal Movement  . Contractions   HPI [name] is a 23 y.o. G2P0101 at 407w3d who presents to MAU today with complaint of decreased FM since this morning and contractions. The patient states that she has only noted few movements this morning and none since 1900. She states that she is also having occasional sharp pain in her pelvic area. She denies tightening of the abdomen. She also denies vaginal bleeding or LOF. Last intercourse was 2 days ago. She has noted a scant amount of brown discharge earlier today. Patient has had issues with PTL earlier in the pregnancy. She is on Procardia and vaginal Progesterone.   OB History   Grav Para Term Preterm Abortions TAB SAB Ect Mult Living   2 1  1      1       Past Medical History  Diagnosis Date  . Medical history non-contributory     Past Surgical History  Procedure Laterality Date  . 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 Diabetes Mother   . Diabetes Maternal Aunt     History  Substance Use Topics  . Smoking status: Never Smoker   . Smokeless tobacco: Not on file  . Alcohol Use: No    Allergies: No Known Allergies  No prescriptions prior to admission    Review of Systems  Constitutional: Negative for fever.  Gastrointestinal: Positive for abdominal pain. Negative for nausea and vomiting.  Genitourinary:       + vaginal bleeding, discharge Neg - LOF   Physical Exam   Blood pressure 119/75, pulse 86, temperature 97.6 F (36.4 C), temperature source Oral, resp. rate 18, last menstrual period 11/25/2012.  Physical Exam  Constitutional: She is oriented to person, place, and time. She appears well-developed and well-nourished. No distress.  HENT:   Head: Normocephalic and atraumatic.  Cardiovascular: Normal rate.   Respiratory: Effort normal.  GI: Soft. She exhibits no distension and no mass. There is no tenderness. There is no rebound and no guarding.  Neurological: She is alert and oriented to person, place, and time.  Skin: Skin is warm and dry. No erythema.  Psychiatric: She has a normal mood and affect.   Fetal Monitoring: Baseline: 125 bpm, moderate variability, + accelerations, no decelerations Contractions: none MAU Course  Procedures None  MDM Discussed with Dr. Ellyn HackBovard. Patient ok for discharge with PTL precautions. Follow-up in the office as scheduled  Assessment and Plan  A: Abdominal pain in pregnancy Reactive NST  P: Discharge home Patient advised to continue current medications Discussed PTL precautions Patient advised to follow-up in the office as scheduled for routine prenatal care Patient may return to MAU as needed or if her condition were to change or worsen  Freddi StarrJulie N Ethier, PA-C  06/19/2013, 5:08 AM

## 2013-07-09 ENCOUNTER — Inpatient Hospital Stay (HOSPITAL_COMMUNITY)
Admission: AD | Admit: 2013-07-09 | Discharge: 2013-07-17 | DRG: 774 | Disposition: A | Discharge status: Home / Self Care | Payer: PRIVATE HEALTH INSURANCE | Source: Ambulatory Visit | Attending: Obstetrics and Gynecology | Admitting: Obstetrics and Gynecology

## 2013-07-09 ENCOUNTER — Encounter (HOSPITAL_COMMUNITY): Payer: Self-pay | Admitting: *Deleted

## 2013-07-09 DIAGNOSIS — O411090 Infection of amniotic sac and membranes, unspecified, unspecified trimester, not applicable or unspecified: Secondary | ICD-10-CM | POA: Diagnosis present

## 2013-07-09 DIAGNOSIS — E669 Obesity, unspecified: Secondary | ICD-10-CM | POA: Diagnosis present

## 2013-07-09 DIAGNOSIS — D696 Thrombocytopenia, unspecified: Secondary | ICD-10-CM | POA: Diagnosis present

## 2013-07-09 DIAGNOSIS — O99344 Other mental disorders complicating childbirth: Secondary | ICD-10-CM | POA: Diagnosis present

## 2013-07-09 DIAGNOSIS — O1403 Mild to moderate pre-eclampsia, third trimester: Secondary | ICD-10-CM

## 2013-07-09 DIAGNOSIS — O99814 Abnormal glucose complicating childbirth: Secondary | ICD-10-CM | POA: Diagnosis present

## 2013-07-09 DIAGNOSIS — O9912 Other diseases of the blood and blood-forming organs and certain disorders involving the immune mechanism complicating childbirth: Secondary | ICD-10-CM

## 2013-07-09 DIAGNOSIS — F909 Attention-deficit hyperactivity disorder, unspecified type: Secondary | ICD-10-CM | POA: Diagnosis present

## 2013-07-09 DIAGNOSIS — D689 Coagulation defect, unspecified: Secondary | ICD-10-CM | POA: Diagnosis present

## 2013-07-09 DIAGNOSIS — IMO0002 Reserved for concepts with insufficient information to code with codable children: Secondary | ICD-10-CM

## 2013-07-09 DIAGNOSIS — O1414 Severe pre-eclampsia complicating childbirth: Principal | ICD-10-CM | POA: Diagnosis present

## 2013-07-09 DIAGNOSIS — O99214 Obesity complicating childbirth: Secondary | ICD-10-CM

## 2013-07-09 DIAGNOSIS — Z6841 Body Mass Index (BMI) 40.0 and over, adult: Secondary | ICD-10-CM

## 2013-07-09 DIAGNOSIS — Z833 Family history of diabetes mellitus: Secondary | ICD-10-CM

## 2013-07-09 HISTORY — DX: Essential (primary) hypertension: I10

## 2013-07-09 LAB — COMPREHENSIVE METABOLIC PANEL
ALT: 15 U/L (ref 0–35)
AST: 15 U/L (ref 0–37)
Albumin: 2.6 g/dL — ABNORMAL LOW (ref 3.5–5.2)
Alkaline Phosphatase: 92 U/L (ref 39–117)
BUN: 10 mg/dL (ref 6–23)
CALCIUM: 9.2 mg/dL (ref 8.4–10.5)
CHLORIDE: 101 meq/L (ref 96–112)
CO2: 22 mEq/L (ref 19–32)
CREATININE: 0.52 mg/dL (ref 0.50–1.10)
GFR calc Af Amer: >90 mL/min (ref >90)
GFR calc non Af Amer: >90 mL/min (ref >90)
Glucose, Bld: 75 mg/dL (ref 70–99)
Potassium: 4.3 mEq/L (ref 3.7–5.3)
Sodium: 138 mEq/L (ref 137–147)
Total Bilirubin: <0.2 mg/dL — ABNORMAL LOW (ref 0.3–1.2)
Total Protein: 5.9 g/dL — ABNORMAL LOW (ref 6.0–8.3)

## 2013-07-09 LAB — PROTEIN / CREATININE RATIO, URINE
CREATININE, URINE: 258 mg/dL
PROTEIN CREATININE RATIO: 8.12 — AB (ref 0.00–0.15)
Total Protein, Urine: 2095.5 mg/dL

## 2013-07-09 LAB — CBC
HCT: 41.3 % (ref 36.0–46.0)
Hemoglobin: 13.9 g/dL (ref 12.0–15.0)
MCH: 28.9 pg (ref 26.0–34.0)
MCHC: 33.7 g/dL (ref 30.0–36.0)
MCV: 85.9 fL (ref 78.0–100.0)
PLATELETS: 159 10*3/uL (ref 150–400)
RBC: 4.81 MIL/uL (ref 3.87–5.11)
RDW: 13 % (ref 11.5–15.5)
WBC: 12.9 10*3/uL — AB (ref 4.0–10.5)

## 2013-07-09 LAB — OB RESULTS CONSOLE GBS: GBS: NEGATIVE

## 2013-07-09 LAB — URINALYSIS, ROUTINE W REFLEX MICROSCOPIC
Bilirubin Urine: NEGATIVE
Glucose, UA: NEGATIVE mg/dL
Ketones, ur: NEGATIVE mg/dL
Leukocytes, UA: NEGATIVE
Nitrite: NEGATIVE
Protein, ur: >300 mg/dL — AB
SPECIFIC GRAVITY, URINE: 1.025 (ref 1.005–1.030)
UROBILINOGEN UA: 0.2 mg/dL (ref 0.0–1.0)
pH: 7 (ref 5.0–8.0)

## 2013-07-09 LAB — LACTATE DEHYDROGENASE: LDH: 151 U/L (ref 94–250)

## 2013-07-09 LAB — URIC ACID: Uric Acid, Serum: 5.5 mg/dL (ref 2.4–7.0)

## 2013-07-09 LAB — URINE MICROSCOPIC-ADD ON

## 2013-07-09 MED ORDER — MAGNESIUM SULFATE BOLUS VIA INFUSION
4.0000 g | Freq: Once | INTRAVENOUS | Status: AC
  Administered 2013-07-09: 4 g via INTRAVENOUS
  Filled 2013-07-09: qty 500

## 2013-07-09 MED ORDER — PROGESTERONE MICRONIZED 200 MG PO CAPS
200.0000 mg | ORAL_CAPSULE | Freq: Every day | ORAL | Status: DC
  Administered 2013-07-09 – 2013-07-13 (×5): 200 mg via ORAL
  Filled 2013-07-09 (×6): qty 1

## 2013-07-09 MED ORDER — ZOLPIDEM TARTRATE 5 MG PO TABS
5.0000 mg | ORAL_TABLET | Freq: Every evening | ORAL | Status: DC | PRN
  Administered 2013-07-11 (×2): 5 mg via ORAL
  Filled 2013-07-09 (×2): qty 1

## 2013-07-09 MED ORDER — BETAMETHASONE SOD PHOS & ACET 6 (3-3) MG/ML IJ SUSP
12.0000 mg | INTRAMUSCULAR | Status: AC
  Administered 2013-07-09 – 2013-07-10 (×2): 12 mg via INTRAMUSCULAR
  Filled 2013-07-09 (×2): qty 2

## 2013-07-09 MED ORDER — PRENATAL MULTIVITAMIN CH
1.0000 | ORAL_TABLET | Freq: Every day | ORAL | Status: DC
  Administered 2013-07-10 – 2013-07-14 (×5): 1 via ORAL
  Filled 2013-07-09 (×6): qty 1

## 2013-07-09 MED ORDER — ASPIRIN 81 MG PO CHEW
81.0000 mg | CHEWABLE_TABLET | Freq: Every day | ORAL | Status: DC
  Administered 2013-07-10 – 2013-07-14 (×5): 81 mg via ORAL
  Filled 2013-07-09 (×6): qty 1

## 2013-07-09 MED ORDER — DOCUSATE SODIUM 100 MG PO CAPS
100.0000 mg | ORAL_CAPSULE | Freq: Every day | ORAL | Status: DC
  Administered 2013-07-10 – 2013-07-14 (×5): 100 mg via ORAL
  Filled 2013-07-09 (×5): qty 1

## 2013-07-09 MED ORDER — ACETAMINOPHEN 325 MG PO TABS
650.0000 mg | ORAL_TABLET | ORAL | Status: DC | PRN
  Administered 2013-07-10 – 2013-07-12 (×5): 650 mg via ORAL
  Filled 2013-07-09 (×5): qty 2

## 2013-07-09 MED ORDER — CALCIUM CARBONATE ANTACID 500 MG PO CHEW
2.0000 | CHEWABLE_TABLET | ORAL | Status: DC | PRN
  Administered 2013-07-13: 400 mg via ORAL
  Filled 2013-07-09 (×2): qty 1
  Filled 2013-07-09: qty 2

## 2013-07-09 MED ORDER — MAGNESIUM SULFATE 40 G IN LACTATED RINGERS - SIMPLE
2.0000 g/h | INTRAVENOUS | Status: DC
  Administered 2013-07-10 (×2): 2 g/h via INTRAVENOUS
  Filled 2013-07-09 (×2): qty 500

## 2013-07-09 MED ORDER — ACETAMINOPHEN 500 MG PO TABS
1000.0000 mg | ORAL_TABLET | Freq: Once | ORAL | Status: AC
  Administered 2013-07-09: 1000 mg via ORAL
  Filled 2013-07-09: qty 2

## 2013-07-09 MED ORDER — LACTATED RINGERS IV SOLN
INTRAVENOUS | Status: DC
  Administered 2013-07-09 – 2013-07-14 (×11): via INTRAVENOUS

## 2013-07-09 NOTE — MAU Provider Note (Signed)
History     CSN: [id number]  Arrival date and time: 07/09/13 1830   First Provider Initiated Contact with Patient 07/09/13 1921      No chief complaint on file.  HPI  [name] is a 23 y.o. female G2P0101 at 68w2dwho presents with blurred vision, elevated BP readings, swelling in feet and hands, and HA. Symptoms started yesterday. The patient had a full PIH eval done yesterday and everything for within normal limits. The patient delivered her last baby at 32 weeks due to preeclampsia; pt was induced with successful vaginal delivery.   OB History   Grav Para Term Preterm Abortions TAB SAB Ect Mult Living   _0 Past Medical History  Diagnosis Date  . Medical history non-contributory   . Hypertension     Past Surgical History  Procedure Laterality Date  . 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 Diabetes Mother   . Diabetes Maternal Aunt     History  Substance Use Topics  . Smoking status: Never Smoker   . Smokeless tobacco: Not on file  . Alcohol Use: No    Allergies: No Known Allergies  Prescriptions prior to admission  Medication Sig Dispense Refill  . aspirin 81 MG chewable tablet Chew 81 mg by mouth daily.      . Prenatal Vit-Fe Fumarate-FA (PRENATAL MULTIVITAMIN) TABS tablet Take 1 tablet by mouth daily at 12 noon.      . progesterone (PROMETRIUM) 200 MG capsule Take 200 mg by mouth at bedtime.      .Marland Kitchenacetaminophen (TYLENOL) 325 MG tablet Take 650 mg by mouth every 6 (six) hours as needed for headache.      .Marland KitchenNIFEdipine (PROCARDIA) 10 MG capsule Take 1 capsule (10 mg total) by mouth every 4 (four) hours as needed.  30 capsule  0   Results for orders placed during the hospital encounter of 07/09/13 (from the past 48 hour(s))  URINALYSIS, ROUTINE W REFLEX MICROSCOPIC     Status: Abnormal   Collection Time    07/09/13  6:37 PM      Result Value Ref Range    Color, Urine YELLOW  YELLOW   APPearance CLEAR  CLEAR   Specific Gravity, Urine 1.025  1.005 - 1.030   pH 7.0  5.0 - 8.0   Glucose, UA NEGATIVE  NEGATIVE mg/dL   Hgb urine dipstick TRACE (*) NEGATIVE   Bilirubin Urine NEGATIVE  NEGATIVE   Ketones, ur NEGATIVE  NEGATIVE mg/dL   Protein, ur >300 (*) NEGATIVE mg/dL   Urobilinogen, UA 0.2  0.0 - 1.0 mg/dL   Nitrite NEGATIVE  NEGATIVE   Leukocytes, UA NEGATIVE  NEGATIVE  PROTEIN / CREATININE RATIO, URINE     Status: None   Collection Time    07/09/13  6:37 PM      Result Value Ref Range   Creatinine, Urine 258.00     Total Protein, Urine PENDING     PROTEIN CREATININE RATIO PENDING  0.00 - 0.15  URINE MICROSCOPIC-ADD ON     Status: Abnormal   Collection Time    07/09/13  6:37 PM      Result Value Ref Range   Squamous Epithelial / LPF RARE  RARE   WBC, UA 0-2  <3 WBC/hpf   RBC / HPF  0-2  <3 RBC/hpf   Bacteria, UA FEW (*) RARE   Urine-Other MUCOUS PRESENT    CBC     Status: Abnormal   Collection Time    07/09/13  7:10 PM      Result Value Ref Range   WBC 12.9 (*) 4.0 - 10.5 K/uL   RBC 4.81  3.87 - 5.11 MIL/uL   Hemoglobin 13.9  12.0 - 15.0 g/dL   HCT 41.3  36.0 - 46.0 %   MCV 85.9  78.0 - 100.0 fL   MCH 28.9  26.0 - 34.0 pg   MCHC 33.7  30.0 - 36.0 g/dL   RDW 13.0  11.5 - 15.5 %   Platelets 159  150 - 400 K/uL  COMPREHENSIVE METABOLIC PANEL     Status: Abnormal   Collection Time    07/09/13  7:10 PM      Result Value Ref Range   Sodium 138  137 - 147 mEq/L   Potassium 4.3  3.7 - 5.3 mEq/L   Chloride 101  96 - 112 mEq/L   CO2 22  19 - 32 mEq/L   Glucose, Bld 75  70 - 99 mg/dL   BUN 10  6 - 23 mg/dL   Creatinine, Ser 0.52  0.50 - 1.10 mg/dL   Calcium 9.2  8.4 - 10.5 mg/dL   Total Protein 5.9 (*) 6.0 - 8.3 g/dL   Albumin 2.6 (*) 3.5 - 5.2 g/dL   AST 15  0 - 37 U/L   ALT 15  0 - 35 U/L   Alkaline Phosphatase 92  39 - 117 U/L   Total Bilirubin <0.2 (*) 0.3 - 1.2 mg/dL   GFR calc non Af Amer >90  >90 mL/min   GFR calc  Af Amer >90  >90 mL/min   Comment: (NOTE)     The eGFR has been calculated using the CKD EPI equation.     This calculation has not been validated in all clinical situations.     eGFR's persistently <90 mL/min signify possible Chronic Kidney     Disease.  URIC ACID     Status: None   Collection Time    07/09/13  7:10 PM      Result Value Ref Range   Uric Acid, Serum 5.5  2.4 - 7.0 mg/dL  LACTATE DEHYDROGENASE     Status: None   Collection Time    07/09/13  7:10 PM      Result Value Ref Range   LDH 151  94 - 250 U/L    Review of Systems  Constitutional: Negative for fever and chills.  Eyes: Positive for blurred vision.  Respiratory: Negative for shortness of breath.   Cardiovascular: Negative for chest pain.  Gastrointestinal: Positive for abdominal pain (Right upper and lower abdominal pain ). Negative for nausea and vomiting.  Genitourinary: Negative for dysuria, urgency, frequency and hematuria.       No vaginal discharge. No vaginal bleeding. No dysuria.   Neurological: Positive for headaches. Negative for dizziness.   Physical Exam   Blood pressure 145/80, pulse 90, temperature 98.5 F (36.9 C), temperature source Oral, resp. rate 20, height _0  (1.422 m), weight 83.632 kg (184 lb 6 oz), last menstrual period 11/25/2012.  Physical Exam  Constitutional: She is oriented to person, place, and time. She appears well-developed and well-nourished. No distress.  HENT:  Head: Normocephalic.  Eyes: Pupils are equal, round, and reactive to light.  Neck: Neck supple.  Cardiovascular: Normal  rate and normal heart sounds.   Respiratory: Effort normal and breath sounds normal. No respiratory distress.  GI: Soft. Normal appearance. There is tenderness in the right upper quadrant and right lower quadrant.  Musculoskeletal: Normal range of motion.       Right ankle: She exhibits swelling.       Left ankle: She exhibits swelling.  +1 pitting edema in bilateral lower extremities    Neurological: She is alert and oriented to person, place, and time. She has normal reflexes. GCS eye subscore is 4. GCS verbal subscore is 5. GCS motor subscore is 6.  Skin: Skin is warm. She is not diaphoretic.  Psychiatric: Her behavior is normal.    Fetal Tracing: Baseline: 135 bpm  Variability: moderate  Accelerations: 15x15 Decelerations: None Toco: No contractions   MAU Course  Procedures None  MDM PIH labs Protein creatinine urine UA Serial blood pressures NST  Patient rates her pain 7/10 and requests tylenol for pain   Assessment and Plan  Report given to V. Tamala Julian CNM who resumes care of the patient.   Darrelyn Hillock Rasch 07/09/2013, 7:22 PM   Results for orders placed during the hospital encounter of 07/09/13 (from the past 24 hour(s))  URINALYSIS, ROUTINE W REFLEX MICROSCOPIC     Status: Abnormal   Collection Time    07/09/13  6:37 PM      Result Value Ref Range   Color, Urine YELLOW  YELLOW   APPearance CLEAR  CLEAR   Specific Gravity, Urine 1.025  1.005 - 1.030   pH 7.0  5.0 - 8.0   Glucose, UA NEGATIVE  NEGATIVE mg/dL   Hgb urine dipstick TRACE (*) NEGATIVE   Bilirubin Urine NEGATIVE  NEGATIVE   Ketones, ur NEGATIVE  NEGATIVE mg/dL   Protein, ur >300 (*) NEGATIVE mg/dL   Urobilinogen, UA 0.2  0.0 - 1.0 mg/dL   Nitrite NEGATIVE  NEGATIVE   Leukocytes, UA NEGATIVE  NEGATIVE  PROTEIN / CREATININE RATIO, URINE     Status: Abnormal   Collection Time    07/09/13  6:37 PM      Result Value Ref Range   Creatinine, Urine 258.00     Total Protein, Urine 2095.5     PROTEIN CREATININE RATIO 8.12 (*) 0.00 - 0.15  URINE MICROSCOPIC-ADD ON     Status: Abnormal   Collection Time    07/09/13  6:37 PM      Result Value Ref Range   Squamous Epithelial / LPF RARE  RARE   WBC, UA 0-2  <3 WBC/hpf   RBC / HPF 0-2  <3 RBC/hpf   Bacteria, UA FEW (*) RARE   Urine-Other MUCOUS PRESENT    CBC     Status: Abnormal   Collection Time    07/09/13  7:10 PM      Result  Value Ref Range   WBC 12.9 (*) 4.0 - 10.5 K/uL   RBC 4.81  3.87 - 5.11 MIL/uL   Hemoglobin 13.9  12.0 - 15.0 g/dL   HCT 41.3  36.0 - 46.0 %   MCV 85.9  78.0 - 100.0 fL   MCH 28.9  26.0 - 34.0 pg   MCHC 33.7  30.0 - 36.0 g/dL   RDW 13.0  11.5 - 15.5 %   Platelets 159  150 - 400 K/uL  COMPREHENSIVE METABOLIC PANEL     Status: Abnormal   Collection Time    07/09/13  7:10 PM      Result Value  Ref Range   Sodium 138  137 - 147 mEq/L   Potassium 4.3  3.7 - 5.3 mEq/L   Chloride 101  96 - 112 mEq/L   CO2 22  19 - 32 mEq/L   Glucose, Bld 75  70 - 99 mg/dL   BUN 10  6 - 23 mg/dL   Creatinine, Ser 0.52  0.50 - 1.10 mg/dL   Calcium 9.2  8.4 - 10.5 mg/dL   Total Protein 5.9 (*) 6.0 - 8.3 g/dL   Albumin 2.6 (*) 3.5 - 5.2 g/dL   AST 15  0 - 37 U/L   ALT 15  0 - 35 U/L   Alkaline Phosphatase 92  39 - 117 U/L   Total Bilirubin <0.2 (*) 0.3 - 1.2 mg/dL   GFR calc non Af Amer >90  >90 mL/min   GFR calc Af Amer >90  >90 mL/min  URIC ACID     Status: None   Collection Time    07/09/13  7:10 PM      Result Value Ref Range   Uric Acid, Serum 5.5  2.4 - 7.0 mg/dL  LACTATE DEHYDROGENASE     Status: None   Collection Time    07/09/13  7:10 PM      Result Value Ref Range   LDH 151  94 - 250 U/L   ASSESSMENT:  1. Pre-eclampsia, mild, third trimester    PLAN: Admit antenatal per consult with Dr. Ulanda Edison. Start 24-hour urine. Betamethasone GBS Repeat CBC and CMP in the morning Magnesium sulfate NICU consult Neonatologist informed patient to admission and high likelihood for preterm delivery.  [place], [place] 07/09/2013 8:29 PM

## 2013-07-09 NOTE — Consult Note (Signed)
Neonatology Consult to Antenatal Patient:  I was asked by Dr. Ambrose MantleHenley to see this patient in order to provide antenatal counseling due to worsening PIH.  Misty Zuniga was admitted tonight at 432 2/[redacted] weeks GA with worsening PIH, for treatment with magnesium sulfate. She is currently not having active labor. She is getting BMZ. Her GTT showed 1 abnormal value and she was retested yesterday, results pending. She had a baby boy at [redacted] weeks GA 3 years ago, so is familiar with NICU. This baby is also a female.  I spoke with the patient and her husband. We discussed the worst case of delivery in the next 1-2 days, including usual DR management, possible respiratory complications and need for support, IV access, feedings (mother desires breast feeding, which was encouraged), LOS, Mortality and Morbidity, and long term outcomes. They did not have any questions at this time. I offered a NICU tour to any interested family members and would be glad to come back if the patient has more questions later.  Thank you for asking me to see this patient.  Doretha Souhristie C. Liana Camerer, MD Neonatologist  The total length of face-to-face or floor/unit time for this encounter was 20 minutes. Counseling and/or coordination of care was 15 minutes of the above.

## 2013-07-09 NOTE — MAU Note (Signed)
PT SAYS SHE WAS  AT DR  OFFICE YESTERDAY  - TOLD HER SHE HAD PROTEIN IN URINE,  DID NST-  GOOD,   SAYS  BP WAS 143/98,  HAD LABS DRAWN- ALL GOOD.   BLURRY VISION  DENIES UC.     DENIES HSV AND  MRSA.      SAYS VISION IS BLURRY  NOW WITH HER  GLASSES- AND HAS BEEN OFF/ ON  SINCE YESTERDAY.     HAS HAD H/A ALL DAY-    TOOK  TYLENOL AT 1PM-  NO RELIEF.    HAS CHEST PAIN UNDER RIGHT BREAST.     IS SUPPOSE TO START 24 HR URINE ON Thursday

## 2013-07-10 ENCOUNTER — Inpatient Hospital Stay (HOSPITAL_COMMUNITY)
Admit: 2013-07-10 | Discharge: 2013-07-10 | Disposition: A | Discharge status: Home / Self Care | Payer: PRIVATE HEALTH INSURANCE | Attending: Obstetrics and Gynecology | Admitting: Obstetrics and Gynecology

## 2013-07-10 LAB — GLUCOSE, CAPILLARY
GLUCOSE-CAPILLARY: 121 mg/dL — AB (ref 70–99)
Glucose-Capillary: 178 mg/dL — ABNORMAL HIGH (ref 70–99)
Glucose-Capillary: 156 mg/dL — ABNORMAL HIGH (ref 70–99)
Glucose-Capillary: 120 mg/dL — ABNORMAL HIGH (ref 70–99)

## 2013-07-10 LAB — CBC
HEMATOCRIT: 42 % (ref 36.0–46.0)
Hemoglobin: 14.3 g/dL (ref 12.0–15.0)
MCH: 29.2 pg (ref 26.0–34.0)
MCHC: 34 g/dL (ref 30.0–36.0)
MCV: 85.7 fL (ref 78.0–100.0)
Platelets: 154 10*3/uL (ref 150–400)
RBC: 4.9 MIL/uL (ref 3.87–5.11)
RDW: 12.9 % (ref 11.5–15.5)
WBC: 14.8 10*3/uL — ABNORMAL HIGH (ref 4.0–10.5)

## 2013-07-10 LAB — COMPREHENSIVE METABOLIC PANEL
ALBUMIN: 2.5 g/dL — AB (ref 3.5–5.2)
ALK PHOS: 97 U/L (ref 39–117)
ALT: 15 U/L (ref 0–35)
AST: 16 U/L (ref 0–37)
BUN: 12 mg/dL (ref 6–23)
CO2: 21 mEq/L (ref 19–32)
Calcium: 8.4 mg/dL (ref 8.4–10.5)
Chloride: 102 mEq/L (ref 96–112)
Creatinine, Ser: 0.63 mg/dL (ref 0.50–1.10)
GFR calc Af Amer: >90 mL/min (ref >90)
GFR calc non Af Amer: >90 mL/min (ref >90)
Glucose, Bld: 133 mg/dL — ABNORMAL HIGH (ref 70–99)
POTASSIUM: 4.8 meq/L (ref 3.7–5.3)
SODIUM: 138 meq/L (ref 137–147)
TOTAL PROTEIN: 5.8 g/dL — AB (ref 6.0–8.3)
Total Bilirubin: <0.2 mg/dL — ABNORMAL LOW (ref 0.3–1.2)

## 2013-07-10 MED ORDER — OXYCODONE-ACETAMINOPHEN 5-325 MG PO TABS
1.0000 | ORAL_TABLET | Freq: Four times a day (QID) | ORAL | Status: DC | PRN
  Administered 2013-07-10 – 2013-07-14 (×4): 1 via ORAL
  Filled 2013-07-10 (×4): qty 1

## 2013-07-10 NOTE — H&P (Signed)
NAMValere Zuniga:  Zuniga, Misty               ACCOUNT NO.:  [id number]  MEDICAL RECORD NO.:  [id number]  LOCATION:  9154                          FACILITY:  [facility]  PHYSICIAN:  Malachi Prohomas F. Ambrose MantleHenley, M.D. DATE OF BIRTH:  1990/12/29  DATE OF ADMISSION:  07/09/2013 DATE OF DISCHARGE:                             HISTORY & PHYSICAL   PRESENT ILLNESS:  This is a 23 year old white female, para 0-1-0-1, gravida 2 with EDC of September 01, 2013, admitted with possible severe preeclampsia.  She has significant proteinuria and she has a headache. Blood group and type B positive, negative antibody.  RPR negative. Urine culture negative.  Hepatitis B surface antigen negative, HIV negative, GC and Chlamydia negative, rubella nonimmune.  Cystic fibrosis screen negative.  One-hour Glucola 152; 3-hour GTT 86, 186, 136, and 97; repeated at 78, 187, 172, and 128.  So, she does have gestational diabetes mellitus.  Repeat HIV and RPR were negative.  Group B strep has not been done.  The patient was seen in the office on July 08, 2013 and was noted to have blood pressure of 140/92 with 3+ proteinuria.  PIH labs were normal.  A 24-hour urine was to be handed in on Thursday morning at 12:30 p.m. on July 09, 2013.  The patient states she began having a severe frontal headache.  She took Tylenol without relief, came to the hospital.  Proteinuria was confirmed.  Blood pressures were somewhat elevated.  Headache was still present.  So, she was admitted to begin a 24-hour urine here and be placed on magnesium sulfate, with a presumptive diagnosis of severe preeclampsia.  The patient had a urine protein creatinine ratio that was 25 times the value that usually equates the 300 mg of protein in 24 hours.  Since admission to the hospital, the patient states her headache is still present, but not as severe.  Her blood pressures have ranged from 129/78 to 155/100.  PAST MEDICAL HISTORY:  She did have severe preeclampsia 3 years ago. She  developed proteinuria at 31 weeks, was admitted to the hospital, was kept a week and ended up delivering at 32 weeks of 4 pounds 12 ounce female infant.  The patient also has a history of ADHD.  SURGICAL HISTORY:  In 2002, she had hip surgery because there was not enough blood supply to her left hip.  ALLERGIES:  No known drug allergies.  No latex allergy.  No food allergy.  SOCIAL HISTORY:  The patient never smoked.  Does not drink alcohol. Denies illicit drugs.  She claims a 2-year college education.  Her occupation is a Arts development officerhome maker.  She is married since 2012.  FAMILY HISTORY:  Sister with ADHD.  Both sisters have this and mother has diabetes mellitus.  PHYSICAL EXAMINATION:  VITAL SIGNS:  On admission, blood pressures have ranged from 129/78 to 155/100.  Her pulse at this point is 120. HEART:  Normal size and sounds.  No murmurs. LUNGS:  Clear to auscultation. ABDOMEN:  Soft.  Fundal height is appropriate for gestational age. Fetal heart tones show no decelerations with moderate variability. Cervix is not examined.  LABORATORY DATA:  CBC:  Hemoglobin was 13.9; hematocrit 41.3; white  count 12,900; platelet count 159,000.  Glucose 75.  Glomerular filtration rate greater than 90.  AST and ALT were both 15.  BUN 10 and creatinine 0.5.  Repeat labs this morning, BUN is 12, creatinine 0.63. Glomerular filtration rate still greater than 90.  AST and ALT of 16 and 15.  Platelet count of 154,000.  ADMITTING IMPRESSION:  Intrauterine pregnancy at 32 weeks with presumed preeclampsia that might be severe.  Her headache may be related to the preeclampsia.  Blood labs are normal.  The patient is admitted.  She has received 1 dose of betamethasone.  She has been placed on magnesium sulfate 4 g bolus with 2 g an hour.  She is in the process of collecting a 24-hour urine.  The spot urine showed greater than 300 mg percent of protein.  Protein creatinine ratio of 8.12 and total protein thought  to be 2095.5 mg in 24 hours.  We will await a full 24-hour urine.  The patient will be discussed and decision made on when and if to deliver the baby.     Malachi Prohomas F. Ambrose MantleHenley, M.D.     TFH/MEDQ  D:  07/10/2013  T:  07/10/2013  Job:  [id number]

## 2013-07-10 NOTE — Progress Notes (Signed)
Admission nutrition screen triggered for weight loss. Patients chart reviewed and assessed  for nutritional risk.Pt has experienced a 4 lb weight gain since 4/13, steady weight gain during pregnancy. Patient is determined to be at low nutrition  risk.   Misty Zuniga M.Odis LusterEd. R.D. LDN Neonatal Nutrition Support Specialist Pager [phone]

## 2013-07-10 NOTE — Progress Notes (Signed)
Ur chart review completed.  

## 2013-07-10 NOTE — Progress Notes (Signed)
Patient ID: Misty PhillipsKatie Amodio, female   DOB: 1990/12/08, 23 y.o.   MRN: [id number] This lady was admitted with HBP, proteinuria and frontal headache not relieved by tylenol. She has received 1 dose of betamethasone and has been placed on magnesium sulfate Her blood labs are normal and her headache has improved since admission. Her first pregnancy was complicated by preeclampsia at 31 weeks that necessitated delivery at 32 weeks. Interestingly , the baby weighed 4 pounds 12 ounces.

## 2013-07-10 NOTE — Progress Notes (Signed)
Patient ID: Misty PhillipsKatie Zuniga, female   DOB: November 27, 1990, 23 y.o.   MRN: [id number]  23yo G2P0101 at 32+ admitted with mild PreE +FM, no LOF, no VB, occ ctx; some HA, blurry vision, no RUQ pain, nl labs  AF 129-159/78-105 gen NAD FHT 120, category 1 toco occ  AST 16, ALT 15, Plt 154, gbbs P  In office had elevated 3 hr will get teaching and check FSBS Had NICU consult Will get US for growth - MFM  Continue Mg until BMZ, second injection 24hr urine until 10:30pm Will change cont EFM/toco; q shift NST

## 2013-07-10 NOTE — Progress Notes (Signed)
BP stable, still has headache U/s with appropriate growth but on the smaller side, fetal EIF, nl AFV, nl UA dopplers Continue current care

## 2013-07-10 NOTE — Progress Notes (Signed)
Antenatal Nutrition Assessment:  Currently  32 3/[redacted] weeks gestation, with pre-eclampsia, GDM. Height  56 "  Weight 186 lbs  pre-pregnancy weight 176 lbs .  Pre-pregnancy  BMI 39.6  IBW 90 lbs Total weight gain 10.lbs Weight gain goals 11-20 lbs Estimated needs: 17-1900 kcal/day, 70-80 grams protein/day, 2.1 liters fluid/day  Carbohydrate modified gestational diet tolerated well, appetite good. Current diet prescription will provide for increased needs.  Nutrition related labs  CBG (last 3)   Recent Labs  07/10/13 0741 07/10/13 1127  GLUCAP 121* 178*       Nutrition Dx: Food and nutrition-related knowledge deficit r/t no previous education aeb newly diagnosed GDM.   Nutrition education consult for Carbohydrate Modified Gestational Diabetic Diet completed.  "Meal  plan for gestational diabetics" handout given to patient.  Basic concepts reviewed.  Questions answered.  Patient verbalizes understanding.   Elisabeth CaraKatherine Semiyah Newgent M.Odis LusterEd. R.D. LDN Neonatal Nutrition Support Specialist Pager [phone]

## 2013-07-10 NOTE — Progress Notes (Addendum)
Inpatient Diabetes Program Recommendations  AACE/ADA: New Consensus Statement on Inpatient Glycemic Control (2013)  Target Ranges:  Fasting - 60-90 mg/dL                             Preprandial- 60-105 mg/dL                            Postprandial- Less than 120 mg/dL (2 hour postprandial)       Results for Misty Zuniga, Misty (MRN [id number]) as of 07/10/2013 10:49  Ref. Range 07/09/2013 19:10 07/10/2013 05:31  Glucose Latest Range: 70-99 mg/dL 75 409133 (H)   Reason for Visit: Consult for Newly dx Gestational DM  Diabetes history: None Outpatient Diabetes medications: NA Current orders for Inpatient glycemic control: CBGs QID (fasting, 2 H postprandial)  Note: Noted patient had elevated glucose with 3 hour GTT and patient is being diagnosed with Gestational Diabetes. Also noted patient received Bethamethasone 12 mg on 4/28 at 22:16 and will receive again tonight.  Placed order for RD consult and for patient to watch education videos (510-Testing Your Blood Glucose and 511-Gestational Diabetes).  Talked with RN and asked that she have patient watch videos and engage patient to check her blood glucose while inpatient).  Called patient and spoke with her over the phone and discussed Gestational Diabetes.  Patient reports that her mother has Type 2 diabetes.  Discussed pathophysiology of GDM, target glucose goals (fasting and 2 hour post prandial), and gestational carb modified diet.  Explained impact of steroids on glucose and informed patient that MD will likely see how glucose responds to diet modifications at this time. Have asked patient to watch videos and informed her that RD will be providing education on diet.  Diabetes Coordinator will continue to follow patient while inpatient. Talked with Amy, RN about care plan.  Amy, RN reports that MD will decide plan based on results of 24 hour urine test.  Diabetes Coordinator will check on the patient first thing in the morning and if patient will be  discharged, Diabetes Coordinator will make face to face visit with the patient to ensure patient understands GDM and what she needs to do to maintain glycemic control during pregnancy.   Thanks, Orlando PennerMarie Ladeidra Borys, RN, MSN, CCRN Diabetes Coordinator Inpatient Diabetes Program [phone] (Team Pager) [phone] ([facility] office) [phone] [facility] office)

## 2013-07-10 NOTE — Progress Notes (Signed)
Dr. Ellyn HackBovard notified of pt,. Complaint of blurred vision; MD state, "I'm aware" possibly from Mag. Sulfate. Notified about Diabetes Education...counselor will be here in the a.m. (4/30) to see patient. No new orders received.

## 2013-07-11 LAB — CBC
HEMATOCRIT: 36.3 % (ref 36.0–46.0)
HEMOGLOBIN: 12.5 g/dL (ref 12.0–15.0)
MCH: 29.7 pg (ref 26.0–34.0)
MCHC: 34.4 g/dL (ref 30.0–36.0)
MCV: 86.2 fL (ref 78.0–100.0)
Platelets: 146 10*3/uL — ABNORMAL LOW (ref 150–400)
RBC: 4.21 MIL/uL (ref 3.87–5.11)
RDW: 13.3 % (ref 11.5–15.5)
WBC: 15.5 10*3/uL — ABNORMAL HIGH (ref 4.0–10.5)

## 2013-07-11 LAB — PROTEIN, URINE, 24 HOUR
COLLECTION INTERVAL-UPROT: 24 h
Protein, 24H Urine: 9768 mg/d — ABNORMAL HIGH (ref 50–100)
Protein, Urine: 1056 mg/dL
Urine Total Volume-UPROT: 925 mL

## 2013-07-11 LAB — COMPREHENSIVE METABOLIC PANEL
ALT: 13 U/L (ref 0–35)
AST: 14 U/L (ref 0–37)
Albumin: 2.3 g/dL — ABNORMAL LOW (ref 3.5–5.2)
Alkaline Phosphatase: 80 U/L (ref 39–117)
BUN: 9 mg/dL (ref 6–23)
CALCIUM: 7.2 mg/dL — AB (ref 8.4–10.5)
CO2: 21 mEq/L (ref 19–32)
Chloride: 102 mEq/L (ref 96–112)
Creatinine, Ser: 0.51 mg/dL (ref 0.50–1.10)
GFR calc non Af Amer: >90 mL/min (ref >90)
GLUCOSE: 124 mg/dL — AB (ref 70–99)
POTASSIUM: 4.6 meq/L (ref 3.7–5.3)
Sodium: 136 mEq/L — ABNORMAL LOW (ref 137–147)
TOTAL PROTEIN: 5.7 g/dL — AB (ref 6.0–8.3)
Total Bilirubin: <0.2 mg/dL — ABNORMAL LOW (ref 0.3–1.2)

## 2013-07-11 LAB — GLUCOSE, CAPILLARY
GLUCOSE-CAPILLARY: 116 mg/dL — AB (ref 70–99)
GLUCOSE-CAPILLARY: 133 mg/dL — AB (ref 70–99)
Glucose-Capillary: 142 mg/dL — ABNORMAL HIGH (ref 70–99)
Glucose-Capillary: 151 mg/dL — ABNORMAL HIGH (ref 70–99)

## 2013-07-11 LAB — CREATININE CLEARANCE, URINE, 24 HOUR
CREATININE 24H UR: 1188 mg/d (ref 700–1800)
CREATININE, URINE: 128.47 mg/dL
Collection Interval-CRCL: 24 hours
Creatinine Clearance: 131 mL/min — ABNORMAL HIGH (ref 75–115)
Creatinine: 0.63 mg/dL (ref 0.50–1.10)
URINE TOTAL VOLUME-CRCL: 925 mL

## 2013-07-11 NOTE — Progress Notes (Signed)
Have reviewed glucose values since Betamethasone given times 2.  Glucose values are only slightly elevated and it is doubtful that pt will need anything except maybe some diet education.  Will follow and glad to assist in any way as needed.  Thank you, Lenor CoffinAnn Mykiah Schmuck, RN, CNS, Diabetes Coordinator [phone])

## 2013-07-11 NOTE — Progress Notes (Signed)
Patient ID: Misty PhillipsKatie Zuniga, female   DOB: 01/11/1991, 23 y.o.   MRN: [id number]  D/W MFM pt and proteinuria.  In light of normal labs and mildly elevated BP would not recommend IOL at this point.  Will keep in house.  If HA not relieved with meds, abn labs (plt <100, Cr >1, elevated LFTs) will have IOL.  Otherwise continue current mgmt  carbmodified diet, FSBS - F/1hr PP  Continue current mgmt

## 2013-07-11 NOTE — Progress Notes (Addendum)
Patient ID: Misty PhillipsKatie Zuniga, female   DOB: 1990/10/31, 23 y.o.   MRN: [id number]  24 hr urine reveals 9 grams of protein.  Plts 159, 154, 146  No c/o's except some dizziness.  +FM, no LOF, no VB, rare ctx.  AFVSS (148/82) gen NAD FHTs 120's toco occ  SVE 1cm/50/-3, vtx  IOL given severe PreE Will consult with MFM Magnesium for prophylaxis Clear liquid diet today

## 2013-07-11 NOTE — Progress Notes (Signed)
Pt and spouse watching realty TV.  Encouraged to watch diabetic videos. No interest.

## 2013-07-11 NOTE — Progress Notes (Signed)
Pt and spouse watching TV encouraged to watch video channel for diabetic teaching.

## 2013-07-11 NOTE — Progress Notes (Signed)
Patient ID: Misty PhillipsKatie Myren, female   DOB: September 10, 1990, 23 y.o.   MRN: [id number] Pt reports ok day, Has had some dull HA, taking tyleonol for it, but states is not any worse, just stable  Eating fine, no nausea or vomiting  BP 120/90  FHR 150 Category 1 earlier, just back on monitor now  Pt being followed for preeclampsia with severe proteinuria.  BP stable  Labs due for repeat tomorrow  Nothing really changed since this AM

## 2013-07-12 LAB — GLUCOSE, CAPILLARY
GLUCOSE-CAPILLARY: 106 mg/dL — AB (ref 70–99)
GLUCOSE-CAPILLARY: 95 mg/dL (ref 70–99)
Glucose-Capillary: 88 mg/dL (ref 70–99)
Glucose-Capillary: 93 mg/dL (ref 70–99)

## 2013-07-12 LAB — CBC
HCT: 35.5 % — ABNORMAL LOW (ref 36.0–46.0)
HEMOGLOBIN: 11.8 g/dL — AB (ref 12.0–15.0)
MCH: 28.9 pg (ref 26.0–34.0)
MCHC: 33.2 g/dL (ref 30.0–36.0)
MCV: 87 fL (ref 78.0–100.0)
Platelets: 138 10*3/uL — ABNORMAL LOW (ref 150–400)
RBC: 4.08 MIL/uL (ref 3.87–5.11)
RDW: 13.6 % (ref 11.5–15.5)
WBC: 12.8 10*3/uL — ABNORMAL HIGH (ref 4.0–10.5)

## 2013-07-12 LAB — COMPREHENSIVE METABOLIC PANEL
ALT: 21 U/L (ref 0–35)
AST: 21 U/L (ref 0–37)
Albumin: 2.2 g/dL — ABNORMAL LOW (ref 3.5–5.2)
Alkaline Phosphatase: 72 U/L (ref 39–117)
BUN: 10 mg/dL (ref 6–23)
CALCIUM: 8.1 mg/dL — AB (ref 8.4–10.5)
CO2: 21 meq/L (ref 19–32)
CREATININE: 0.47 mg/dL — AB (ref 0.50–1.10)
Chloride: 103 mEq/L (ref 96–112)
GLUCOSE: 87 mg/dL (ref 70–99)
Potassium: 4.2 mEq/L (ref 3.7–5.3)
Sodium: 138 mEq/L (ref 137–147)
TOTAL PROTEIN: 5.5 g/dL — AB (ref 6.0–8.3)
Total Bilirubin: <0.2 mg/dL — ABNORMAL LOW (ref 0.3–1.2)

## 2013-07-12 LAB — CULTURE, BETA STREP (GROUP B ONLY)

## 2013-07-12 MED ORDER — MAGNESIUM SULFATE 40 G IN LACTATED RINGERS - SIMPLE: 2.0000 g/h | INTRAVENOUS | Status: DC

## 2013-07-12 MED ORDER — MAGNESIUM SULFATE 40 G IN LACTATED RINGERS - SIMPLE
2.0000 g/h | INTRAVENOUS | Status: DC
  Administered 2013-07-14: 2 g/h via INTRAVENOUS
  Filled 2013-07-12 (×3): qty 500

## 2013-07-12 MED ORDER — MAGNESIUM SULFATE 4000MG/100ML IJ SOLN: 4.0000 g | Freq: Once | INTRAMUSCULAR | Status: DC

## 2013-07-12 MED ORDER — MAGNESIUM SULFATE BOLUS VIA INFUSION
4.0000 g | Freq: Once | INTRAVENOUS | Status: AC
  Administered 2013-07-12: 4 g via INTRAVENOUS
  Filled 2013-07-12: qty 500

## 2013-07-12 NOTE — Progress Notes (Signed)
Patient ID: Misty PhillipsKatie Zuniga, female   DOB: 1990/08/29, 23 y.o.   MRN: [id number] Pt states she feels awful with headache and dizziness. Her BP is 150/98 Will restart Magnesium sulfate. Weight has increased 12.8 pounds since admission Will watch closely for signs of CHF

## 2013-07-12 NOTE — Progress Notes (Signed)
Patient ID: Misty PhillipsKatie Zuniga, female   DOB: Jul 18, 1990, 23 y.o.   MRN: [id number]  23yo G2P0101 at 32+ with mild PreE, 9g proteinuria.  Feels same.  Dull HA, no better or worse.  Some scotomata, blurry vision.  +FM, no LOF, no VB, occ ctx.  Has some mucousy d/c  AF BP 123-170/68-94 (139/68) good uop Labs P gen NAD FHTs category 1 toco occ SVE yest 1 cm, vtx  Continue current plan, close monitoring. Daily labs and sx's to guide care

## 2013-07-12 NOTE — Progress Notes (Signed)
Toco placed on patient.

## 2013-07-13 ENCOUNTER — Encounter (HOSPITAL_COMMUNITY): Payer: Self-pay | Admitting: Anesthesiology

## 2013-07-13 LAB — COMPREHENSIVE METABOLIC PANEL
ALBUMIN: 1.9 g/dL — AB (ref 3.5–5.2)
ALK PHOS: 71 U/L (ref 39–117)
ALT: 28 U/L (ref 0–35)
AST: 24 U/L (ref 0–37)
BUN: 10 mg/dL (ref 6–23)
CO2: 24 mEq/L (ref 19–32)
Calcium: 8.1 mg/dL — ABNORMAL LOW (ref 8.4–10.5)
Chloride: 101 mEq/L (ref 96–112)
Creatinine, Ser: 0.49 mg/dL — ABNORMAL LOW (ref 0.50–1.10)
GFR calc non Af Amer: >90 mL/min (ref >90)
GLUCOSE: 83 mg/dL (ref 70–99)
POTASSIUM: 4.2 meq/L (ref 3.7–5.3)
SODIUM: 137 meq/L (ref 137–147)
Total Bilirubin: <0.2 mg/dL — ABNORMAL LOW (ref 0.3–1.2)
Total Protein: 4.8 g/dL — ABNORMAL LOW (ref 6.0–8.3)

## 2013-07-13 LAB — GLUCOSE, CAPILLARY
GLUCOSE-CAPILLARY: 93 mg/dL (ref 70–99)
GLUCOSE-CAPILLARY: 129 mg/dL — AB (ref 70–99)
GLUCOSE-CAPILLARY: 102 mg/dL — AB (ref 70–99)
Glucose-Capillary: 79 mg/dL (ref 70–99)

## 2013-07-13 LAB — CBC
HCT: 36.8 % (ref 36.0–46.0)
HEMOGLOBIN: 12.3 g/dL (ref 12.0–15.0)
MCH: 29 pg (ref 26.0–34.0)
MCHC: 33.4 g/dL (ref 30.0–36.0)
MCV: 86.8 fL (ref 78.0–100.0)
PLATELETS: 127 10*3/uL — AB (ref 150–400)
RBC: 4.24 MIL/uL (ref 3.87–5.11)
RDW: 13.4 % (ref 11.5–15.5)
WBC: 15.2 10*3/uL — ABNORMAL HIGH (ref 4.0–10.5)

## 2013-07-13 MED ORDER — GI COCKTAIL ~~LOC~~
30.0000 mL | Freq: Once | ORAL | Status: AC
  Administered 2013-07-13: 30 mL via ORAL
  Filled 2013-07-13: qty 30

## 2013-07-13 MED ORDER — SODIUM CHLORIDE 0.9 % IJ SOLN
INTRAMUSCULAR | Status: AC
  Filled 2013-07-13: qty 3

## 2013-07-13 NOTE — Progress Notes (Signed)
MD notified of epigastric pain and orders received for GI cocktail after review of labs

## 2013-07-13 NOTE — Progress Notes (Signed)
Patient ID: Misty PhillipsKatie Gude, female   DOB: [birth year], 23 y.o.   MRN: [id number] Pt states her sx are unchanged. Her BP is 156/89 She has gained over 15 pounds since admission. Her platelet count continues a downward trend but is still 127K Her LFT's are normal I spoke to Dr. Otho PerlNitsche last night and he feels that worsening headache is a valid reason for delivery. I have instructed the pt to inform us if she feels the headache worsens significantly. Her prior headache history makes the headache a difficult determining factor for delivery

## 2013-07-13 NOTE — Progress Notes (Signed)
Multiple conversations about peripheral options and PICC options and central line options, IV team called and could come in the morning.  Dr. Ambrose MantleHenley made aware and needs IV access even if that means central line placement.  Dr. Cristela BlueKyle Jackson agreeing to place line.

## 2013-07-13 NOTE — Progress Notes (Addendum)
PT back to room after peripheral 20g placed in the right wrist By Willis ModenaPaige RN.  Magnesium restarted per Dr. Ambrose MantleHenley verbal order at 2gram/hr.  MD updated on her h/a and increased BP.  Per MD get the Magnesium restarted and see if she improves.

## 2013-07-13 NOTE — Progress Notes (Signed)
RN called to room and pt c/o nausea after breakfast.  No emesis.  GI cocktail given

## 2013-07-13 NOTE — Progress Notes (Signed)
CRNA Marylene Landngela attempted 22g with no success

## 2013-07-13 NOTE — Progress Notes (Signed)
Pt taken to Recovery room for central line placement.

## 2013-07-13 NOTE — Progress Notes (Signed)
Dr Ambrose MantleHenley called Re:  Need for IV access. CRNA's and I have both looked and several attempts at peripheral IV's have been attempted. I suggested a PICC line for her possible long term use as she is 32wks preg.  That service is not available at this time. I have discussed a central line as a possibility with Dr Ambrose MantleHenley, and he agrees. Plts are satisfactory.

## 2013-07-13 NOTE — Progress Notes (Signed)
IV noted to be infiltrated and swelling, so it was d/c'd

## 2013-07-13 NOTE — Progress Notes (Signed)
CRNA Marylene Landngela called and to come and attempt IV.  PT very swollen.

## 2013-07-14 ENCOUNTER — Inpatient Hospital Stay (HOSPITAL_COMMUNITY): Payer: PRIVATE HEALTH INSURANCE

## 2013-07-14 DIAGNOSIS — R0602 Shortness of breath: Secondary | ICD-10-CM

## 2013-07-14 LAB — CBC
HCT: 37.6 % (ref 36.0–46.0)
Hemoglobin: 12.7 g/dL (ref 12.0–15.0)
MCH: 29.1 pg (ref 26.0–34.0)
MCHC: 33.8 g/dL (ref 30.0–36.0)
MCV: 86.2 fL (ref 78.0–100.0)
PLATELETS: 134 10*3/uL — AB (ref 150–400)
RBC: 4.36 MIL/uL (ref 3.87–5.11)
RDW: 13.2 % (ref 11.5–15.5)
WBC: 13.3 10*3/uL — AB (ref 4.0–10.5)

## 2013-07-14 LAB — GLUCOSE, CAPILLARY
GLUCOSE-CAPILLARY: 103 mg/dL — AB (ref 70–99)
Glucose-Capillary: 76 mg/dL (ref 70–99)
Glucose-Capillary: 108 mg/dL — ABNORMAL HIGH (ref 70–99)

## 2013-07-14 LAB — COMPREHENSIVE METABOLIC PANEL
ALT: 34 U/L (ref 0–35)
AST: 28 U/L (ref 0–37)
Albumin: 1.8 g/dL — ABNORMAL LOW (ref 3.5–5.2)
Alkaline Phosphatase: 75 U/L (ref 39–117)
BUN: 11 mg/dL (ref 6–23)
CALCIUM: 7.6 mg/dL — AB (ref 8.4–10.5)
CHLORIDE: 100 meq/L (ref 96–112)
CO2: 23 mEq/L (ref 19–32)
CREATININE: 0.49 mg/dL — AB (ref 0.50–1.10)
GFR calc Af Amer: >90 mL/min (ref >90)
GFR calc non Af Amer: >90 mL/min (ref >90)
Glucose, Bld: 88 mg/dL (ref 70–99)
Potassium: 4 mEq/L (ref 3.7–5.3)
SODIUM: 136 meq/L — AB (ref 137–147)
Total Bilirubin: <0.2 mg/dL — ABNORMAL LOW (ref 0.3–1.2)
Total Protein: 5.2 g/dL — ABNORMAL LOW (ref 6.0–8.3)

## 2013-07-14 LAB — TROPONIN I: Troponin I: <0.3 ng/mL (ref <0.30)

## 2013-07-14 LAB — PRO B NATRIURETIC PEPTIDE: PRO B NATRI PEPTIDE: 409.8 pg/mL — AB (ref 0–125)

## 2013-07-14 MED ORDER — OXYCODONE-ACETAMINOPHEN 5-325 MG PO TABS
1.0000 | ORAL_TABLET | Freq: Four times a day (QID) | ORAL | Status: DC | PRN
  Administered 2013-07-14: 2 via ORAL
  Filled 2013-07-14: qty 2

## 2013-07-14 MED ORDER — FUROSEMIDE 10 MG/ML IJ SOLN
5.0000 mg | Freq: Once | INTRAMUSCULAR | Status: AC
  Administered 2013-07-14: 5 mg via INTRAVENOUS
  Filled 2013-07-14: qty 0.5

## 2013-07-14 MED ORDER — OXYTOCIN 40 UNITS IN LACTATED RINGERS INFUSION - SIMPLE MED
1.0000 m[IU]/min | INTRAVENOUS | Status: DC
  Administered 2013-07-14: 1 m[IU]/min via INTRAVENOUS
  Filled 2013-07-14: qty 1000

## 2013-07-14 MED ORDER — SODIUM CHLORIDE 0.9 % IJ SOLN
10.0000 mL | Freq: Two times a day (BID) | INTRAMUSCULAR | Status: DC
  Administered 2013-07-15 – 2013-07-16 (×4): 10 mL
  Filled 2013-07-14: qty 40

## 2013-07-14 MED ORDER — SODIUM CHLORIDE 0.9 % IJ SOLN
10.0000 mL | INTRAMUSCULAR | Status: DC | PRN
  Administered 2013-07-14 – 2013-07-16 (×2): 10 mL

## 2013-07-14 NOTE — Progress Notes (Signed)
Patient ID: Misty PhillipsKatie Revelo, female   DOB: Sep 06, 1990, 23 y.o.   MRN: [id number] Pt c/o worsening SOB She states the SOB began this AM and has worsened during the day.Her BP has remained elevated 153/94 to 175/110 CXR appears unchanged and her heart and lungs sound normal. The cervix is 2 cm 50 % effaced and the vertex is at - 2 station. I will call cardiology

## 2013-07-14 NOTE — Progress Notes (Signed)
MD called and updated on pt status and complaints and BP.  Orders received for CXR and decrease LR to 25cc/hr

## 2013-07-14 NOTE — Progress Notes (Signed)
MD updated on patient BPs and complaints of H/a that is only relieved briefly with the Percocet.  Orders received.

## 2013-07-14 NOTE — Progress Notes (Signed)
Patient ID: Misty Zuniga, [name]   DOB: December 21, 1990, 23 y.o.   MRN: [id number] The pt was seen by cardiology and their thought is that she is volume overloaded but not in CHF I have spoken to Dr. Otho PerlNitsche and he does not object to lasix.I have given 5 mg lasix. Because of the persistent HBP, headache and volume overload I have offered induction and the pt wants induction claiming she cannot go on with her headache and  shortness of breath . I will transfer her to L&D and begin induction of labor

## 2013-07-14 NOTE — Consult Note (Signed)
Reason for Consult: shortness of breath in [redacted] week pregnant woman with preclampsia Referring Physician: Dr. Velora Mediate is an 23 y.o. female.  HPI: Ms. Ziesmer is a 23 yo G2P1 woman with PMH of pre-eclampsia 3 years ago and delivery at 39 weeks of female infant weighing 4 lbs 12 oz who was admitted 07/09/13 with concern for pre-eclampsia with persistent headache, 9 grams of protein in her 24 hr urine who has gained 10-15 lbs in the last 5 days and began having shortness of breath last night leading to cardiology consultation. Ms. Loney tells me she had no exercise limitations before pregnancy, no difficulty with inclines and no syncope. Five days ago she started noticing quite a bit of swelling and she was admitted and found to have significant proteinuria and elevated blood pressure. She also has had a persistent headache. Otherwise, she continues to have a headache, remains in good spirits. She had a close friend visit and her husband came in during my brief bedside echocardiogram. Portable US without color doppler demonstrated preserved EF, normal LV/RV chamber sizes in parasternal long axis without evidence of anterior pericardial effusion. Apical views demonstrated fairly normal chamber sizes of RV/LV and preserved RV function/LV function as best as I could see. Subcostal views without overt pericardial effusion.     Past Medical History  Diagnosis Date  . Medical history non-contributory   . Hypertension     Past Surgical History  Procedure Laterality Date  . 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 Diabetes Mother   . Diabetes Maternal Aunt     Social History:  reports that she has never smoked. She does not have any smokeless tobacco history on file. She reports that she does not drink alcohol or use illicit drugs.  Allergies: No Known Allergies  Medications:  I have reviewed the patient's  current medications. Prior to Admission:  Prescriptions prior to admission  Medication Sig Dispense Refill  . acetaminophen (TYLENOL) 325 MG tablet Take 650 mg by mouth every 6 (six) hours as needed for headache.      Marland Kitchen aspirin 81 MG chewable tablet Chew 81 mg by mouth daily.      Marland Kitchen NIFEdipine (PROCARDIA) 10 MG capsule Take 1 capsule (10 mg total) by mouth every 4 (four) hours as needed.  30 capsule  0  . Prenatal Vit-Fe Fumarate-FA (PRENATAL MULTIVITAMIN) TABS tablet Take 1 tablet by mouth daily at 12 noon.      . progesterone (PROMETRIUM) 200 MG capsule Take 200 mg by mouth at bedtime.       Scheduled: . aspirin  81 mg Oral Daily  . docusate sodium  100 mg Oral Daily  . furosemide  5 mg Intravenous Once  . prenatal multivitamin  1 tablet Oral Q1200  . progesterone  200 mg Oral QHS  . sodium chloride  10-40 mL Intracatheter Q12H   Continuous: . lactated ringers 25 mL/hr at 07/14/13 1726  . magnesium sulfate 2 g/hr (07/14/13 1726)    Results for orders placed during the hospital encounter of 07/09/13 (from the past 48 hour(s))  GLUCOSE, CAPILLARY     Status: None   Collection Time    07/12/13  8:01 PM      Result Value Ref Range   Glucose-Capillary 93  70 - 99 mg/dL  GLUCOSE, CAPILLARY     Status: None  Collection Time    07/13/13  5:19 AM      Result Value Ref Range   Glucose-Capillary 79  70 - 99 mg/dL  CBC     Status: Abnormal   Collection Time    07/13/13  5:20 AM      Result Value Ref Range   WBC 15.2 (*) 4.0 - 10.5 K/uL   RBC 4.24  3.87 - 5.11 MIL/uL   Hemoglobin 12.3  12.0 - 15.0 g/dL   HCT 54.9  65.6 - 59.9 %   MCV 86.8  78.0 - 100.0 fL   MCH 29.0  26.0 - 34.0 pg   MCHC 33.4  30.0 - 36.0 g/dL   RDW 43.7  19.0 - 70.7 %   Platelets 127 (*) 150 - 400 K/uL  COMPREHENSIVE METABOLIC PANEL     Status: Abnormal   Collection Time    07/13/13  5:20 AM      Result Value Ref Range   Sodium 137  137 - 147 mEq/L   Potassium 4.2  3.7 - 5.3 mEq/L   Chloride 101  96 - 112  mEq/L   CO2 24  19 - 32 mEq/L   Glucose, Bld 83  70 - 99 mg/dL   BUN 10  6 - 23 mg/dL   Creatinine, Ser 2.17 (*) 0.50 - 1.10 mg/dL   Calcium 8.1 (*) 8.4 - 10.5 mg/dL   Total Protein 4.8 (*) 6.0 - 8.3 g/dL   Albumin 1.9 (*) 3.5 - 5.2 g/dL   AST 24  0 - 37 U/L   ALT 28  0 - 35 U/L   Alkaline Phosphatase 71  39 - 117 U/L   Total Bilirubin <0.2 (*) 0.3 - 1.2 mg/dL   GFR calc non Af Amer >90  >90 mL/min   GFR calc Af Amer >90  >90 mL/min   Comment: (NOTE)     The eGFR has been calculated using the CKD EPI equation.     This calculation has not been validated in all clinical situations.     eGFR's persistently <90 mL/min signify possible Chronic Kidney     Disease.  GLUCOSE, CAPILLARY     Status: None   Collection Time    07/13/13 11:34 AM      Result Value Ref Range   Glucose-Capillary 93  70 - 99 mg/dL   Comment 1 Notify RN    GLUCOSE, CAPILLARY     Status: Abnormal   Collection Time    07/13/13  4:01 PM      Result Value Ref Range   Glucose-Capillary 129 (*) 70 - 99 mg/dL   Comment 1 Notify RN    GLUCOSE, CAPILLARY     Status: Abnormal   Collection Time    07/13/13  9:08 PM      Result Value Ref Range   Glucose-Capillary 102 (*) 70 - 99 mg/dL  CBC     Status: Abnormal   Collection Time    07/14/13  5:40 AM      Result Value Ref Range   WBC 13.3 (*) 4.0 - 10.5 K/uL   RBC 4.36  3.87 - 5.11 MIL/uL   Hemoglobin 12.7  12.0 - 15.0 g/dL   HCT 11.6  54.6 - 12.4 %   MCV 86.2  78.0 - 100.0 fL   MCH 29.1  26.0 - 34.0 pg   MCHC 33.8  30.0 - 36.0 g/dL   RDW 32.7  55.6 - 23.9 %   Platelets  134 (*) 150 - 400 K/uL  COMPREHENSIVE METABOLIC PANEL     Status: Abnormal   Collection Time    07/14/13  5:40 AM      Result Value Ref Range   Sodium 136 (*) 137 - 147 mEq/L   Potassium 4.0  3.7 - 5.3 mEq/L   Chloride 100  96 - 112 mEq/L   CO2 23  19 - 32 mEq/L   Glucose, Bld 88  70 - 99 mg/dL   BUN 11  6 - 23 mg/dL   Creatinine, Ser 0.49 (*) 0.50 - 1.10 mg/dL   Calcium 7.6 (*) 8.4 -  10.5 mg/dL   Total Protein 5.2 (*) 6.0 - 8.3 g/dL   Albumin 1.8 (*) 3.5 - 5.2 g/dL   AST 28  0 - 37 U/L   ALT 34  0 - 35 U/L   Alkaline Phosphatase 75  39 - 117 U/L   Total Bilirubin <0.2 (*) 0.3 - 1.2 mg/dL   GFR calc non Af Amer >90  >90 mL/min   GFR calc Af Amer >90  >90 mL/min   Comment: (NOTE)     The eGFR has been calculated using the CKD EPI equation.     This calculation has not been validated in all clinical situations.     eGFR's persistently <90 mL/min signify possible Chronic Kidney     Disease.  GLUCOSE, CAPILLARY     Status: None   Collection Time    07/14/13  5:47 AM      Result Value Ref Range   Glucose-Capillary 76  70 - 99 mg/dL  GLUCOSE, CAPILLARY     Status: Abnormal   Collection Time    07/14/13  1:12 PM      Result Value Ref Range   Glucose-Capillary 108 (*) 70 - 99 mg/dL   Comment 1 Notify RN      Dg Chest 1 View  07/14/2013   CLINICAL DATA:  .  EXAM: CHEST - 1 VIEW  COMPARISON:  None.  FINDINGS: PICC line noted in good anatomic position. Mild cardiomegaly. Mild perihilar interstitial prominence again noted. No pleural effusion or pneumothorax.  IMPRESSION: 1. PICC line in stable position. 2. Stable cardiomegaly. 3. Stable mild perihilar interstitial prominence. Pneumonitis and/or interstitial pulmonary edema could present in this fashion.   Electronically Signed   By: Gray   On: 07/14/2013 17:04   Dg Chest Port 1 View  07/14/2013   CLINICAL DATA:  PICC line placement.  EXAM: PORTABLE CHEST - 1 VIEW  COMPARISON:  05/15/2010 chest radiograph  FINDINGS: Upper limits normal heart size again noted.  A right PICC line is present with tip overlying the cavoatrial junction.  Mild peribronchial thickening is unchanged.  There is no evidence of focal airspace disease, pulmonary edema, suspicious pulmonary nodule/mass, pleural effusion, or pneumothorax. No acute bony abnormalities are identified.  IMPRESSION: PICC line with tip overlying the cavoatrial junction.   Upper limits normal heart size and mild peribronchial thickening again noted.   Electronically Signed   By: Hassan Rowan M.D.   On: 07/14/2013 11:08    Review of Systems  Constitutional: Positive for malaise/fatigue. Negative for fever and chills.  HENT: Negative for hearing loss and nosebleeds.   Eyes: Negative for pain and discharge.  Respiratory: Positive for shortness of breath. Negative for hemoptysis.   Cardiovascular: Positive for orthopnea and leg swelling. Negative for chest pain.  Gastrointestinal: Positive for constipation. Negative for abdominal pain.  Genitourinary: Negative for  dysuria and hematuria.  Musculoskeletal: Negative for falls and neck pain.  Skin: Negative for rash.  Neurological: Positive for headaches. Negative for speech change, focal weakness and seizures.  Endo/Heme/Allergies: Negative for polydipsia. Does not bruise/bleed easily.  Psychiatric/Behavioral: Negative for suicidal ideas, hallucinations and substance abuse.   Blood pressure 169/103, pulse 98, temperature 98.5 F (36.9 C), temperature source Oral, resp. rate 20, height $RemoveBe'4\' 8"'KqztrYiPA$  (1.422 m), weight 90.674 kg (199 lb 14.4 oz), last menstrual period 11/25/2012, SpO2 93.00%. Physical Exam  Nursing note and vitals reviewed. Constitutional: She is oriented to person, place, and time. She appears well-developed and well-nourished.  Dyspneic, mild to moderately uncomfortable, no overt distress  HENT:  Head: Normocephalic and atraumatic.  Nose: Nose normal.  Mouth/Throat: Oropharynx is clear and moist. No oropharyngeal exudate.  Eyes: Conjunctivae and EOM are normal. Pupils are equal, round, and reactive to light. No scleral icterus.  Neck: Normal range of motion. Neck supple. JVD present. No tracheal deviation present.  Cardiovascular: Regular rhythm, normal heart sounds and intact distal pulses.   No murmur heard. Tachycardic, regular  Respiratory: Effort normal and breath sounds normal. No respiratory  distress. She has no wheezes. She exhibits no tenderness.  GI: Bowel sounds are normal.  Pregnant  Musculoskeletal: She exhibits edema. She exhibits no tenderness.  Hands/feet with 1+ edema bilatearlly  Neurological: She is alert and oriented to person, place, and time. No cranial nerve deficit. Coordination normal.  Skin: Skin is warm and dry. No rash noted. She is not diaphoretic.  Psychiatric: She has a normal mood and affect. Her behavior is normal. Judgment and thought content normal.  Labs reviewed above Chest x-ray with increased vascularity and mild interstitial edema  Brief portable echocardiogram at bedside as reported above  Problem List  Pre-eclampsia Hypertension Shortness of breath Elevated JVP Lower Extremity edema  Assessment/Plan: 23 yo woman G2P1 33 weeks pregnancy woman here with pre-eclampsia, hypertension, shortness of breath. Cardiology consulted to evaluate for acute heart failure. Bedside US not consistent with systolic HF. Differential for SOB also includes PE, ACS, anxiety, preclampsia among others.  Her neck veins are elevated and evidence of mild interstitial edema on chest x-ray.  She has evidence of high circulatory volume of pregnancy and pre-eclampsia.  At this juncture, I favor improved blood pressure control - labetalol gtt would be good choice or IV hydralazine. Could consider low dose IV lasix ($RemoveB'5mg'CjFXyUXS$  or 10 mg) as well but this can effect fetal circulatory volume as well.  If early delivery is decided, then diuresis will likely be warranted after delivery.  Please call with questions or change in clinical status. I spent 60 minutes with the patient and answered all questions. I also discussed my findings and plan with Dr. Domenic Polite and Dr. Ulanda Edison.   - Blood pressure control  - Full echocardiogram in AM  - Call with changes in status  Jules Husbands 07/14/2013, 7:35 PM

## 2013-07-14 NOTE — Progress Notes (Addendum)
RN called to room with complaints of SOB.  Pt describes as unable to take a deep breath.  Lungs assessed and clear.

## 2013-07-14 NOTE — Progress Notes (Signed)
Patient ID: Misty PhillipsKatie Zuniga, female   DOB: Nov 10, 1990, 23 y.o.   MRN: [id number] BP 175/110 Platelets are slightly improved. Pt states she feels the same Her weight is stable Last night IV access was not attainable so a central line was scheduled but an RN was able to start a #20 gauge in her hand. Since she is probably approaching delivery a PICC line , which was advised by anesthesia will be inserted. If her BP remains elevated above 160/105 will treat it

## 2013-07-14 NOTE — Progress Notes (Signed)
Peripherally Inserted Central Catheter/Midline Placement  The IV Nurse has discussed with the patient and/or persons authorized to consent for the patient, the purpose of this procedure and the potential benefits and risks involved with this procedure.  The benefits include less needle sticks, lab draws from the catheter and patient may be discharged home with the catheter.  Risks include, but not limited to, infection, bleeding, blood clot (thrombus formation), and puncture of an artery; nerve damage and irregular heat beat.  Alternatives to this procedure were also discussed.  PICC/Midline Placement Documentation  PICC / Midline Double Lumen 07/14/13 PICC Right Cephalic 35 cm 0 cm (Active)  Indication for Insertion or Continuance of Line Poor Vasculature-patient has had multiple peripheral attempts or PIVs lasting less than 24 hours 07/14/2013 10:30 AM  Exposed Catheter (cm) 0 cm 07/14/2013 10:30 AM  Site Assessment Clean;Dry;Intact 07/14/2013 10:30 AM  Lumen #1 Status Flushed;Saline locked;Blood return noted 07/14/2013 10:30 AM  Lumen #2 Status Flushed;Saline locked;Blood return noted 07/14/2013 10:30 AM  Dressing Type Occlusive 07/14/2013 10:30 AM  Dressing Status Clean;Dry;Intact;Antimicrobial disc in place 07/14/2013 10:30 AM  Dressing Intervention New dressing 07/14/2013 10:30 AM  Dressing Change Due 07/21/13 07/14/2013 10:30 AM       Misty Zuniga Robert Khalidah Herbold 07/14/2013, 10:37 AM

## 2013-07-15 ENCOUNTER — Encounter (HOSPITAL_COMMUNITY): Payer: PRIVATE HEALTH INSURANCE | Admitting: Anesthesiology

## 2013-07-15 ENCOUNTER — Encounter (HOSPITAL_COMMUNITY): Payer: Self-pay | Admitting: Obstetrics and Gynecology

## 2013-07-15 ENCOUNTER — Inpatient Hospital Stay (HOSPITAL_COMMUNITY): Payer: PRIVATE HEALTH INSURANCE | Admitting: Anesthesiology

## 2013-07-15 LAB — COMPREHENSIVE METABOLIC PANEL
ALK PHOS: 88 U/L (ref 39–117)
ALT: 31 U/L (ref 0–35)
ALT: 30 U/L (ref 0–35)
AST: 31 U/L (ref 0–37)
AST: 26 U/L (ref 0–37)
Albumin: 1.8 g/dL — ABNORMAL LOW (ref 3.5–5.2)
Albumin: 1.9 g/dL — ABNORMAL LOW (ref 3.5–5.2)
Alkaline Phosphatase: 85 U/L (ref 39–117)
BUN: 10 mg/dL (ref 6–23)
BUN: 9 mg/dL (ref 6–23)
CALCIUM: 7.6 mg/dL — AB (ref 8.4–10.5)
CALCIUM: 7.9 mg/dL — AB (ref 8.4–10.5)
CO2: 23 mEq/L (ref 19–32)
CO2: 25 meq/L (ref 19–32)
CREATININE: 0.63 mg/dL (ref 0.50–1.10)
Chloride: 99 mEq/L (ref 96–112)
Chloride: 99 mEq/L (ref 96–112)
Creatinine, Ser: 0.55 mg/dL (ref 0.50–1.10)
GFR calc non Af Amer: >90 mL/min (ref >90)
GFR calc non Af Amer: >90 mL/min (ref >90)
GLUCOSE: 88 mg/dL (ref 70–99)
GLUCOSE: 93 mg/dL (ref 70–99)
Potassium: 4.2 mEq/L (ref 3.7–5.3)
Potassium: 4.2 mEq/L (ref 3.7–5.3)
SODIUM: 136 meq/L — AB (ref 137–147)
Sodium: 137 mEq/L (ref 137–147)
Total Bilirubin: <0.2 mg/dL — ABNORMAL LOW (ref 0.3–1.2)
Total Bilirubin: <0.2 mg/dL — ABNORMAL LOW (ref 0.3–1.2)
Total Protein: 4.9 g/dL — ABNORMAL LOW (ref 6.0–8.3)
Total Protein: 5.5 g/dL — ABNORMAL LOW (ref 6.0–8.3)

## 2013-07-15 LAB — GLUCOSE, CAPILLARY
Glucose-Capillary: 89 mg/dL (ref 70–99)
Glucose-Capillary: 163 mg/dL — ABNORMAL HIGH (ref 70–99)

## 2013-07-15 LAB — TYPE AND SCREEN
ABO/RH(D): B POS
ANTIBODY SCREEN: NEGATIVE

## 2013-07-15 LAB — CBC
HCT: 40.2 % (ref 36.0–46.0)
HCT: 39.4 % (ref 36.0–46.0)
HEMATOCRIT: 40.8 % (ref 36.0–46.0)
HEMOGLOBIN: 14.1 g/dL (ref 12.0–15.0)
HEMOGLOBIN: 13.3 g/dL (ref 12.0–15.0)
Hemoglobin: 13.9 g/dL (ref 12.0–15.0)
MCH: 29.6 pg (ref 26.0–34.0)
MCH: 29.7 pg (ref 26.0–34.0)
MCH: 29 pg (ref 26.0–34.0)
MCHC: 34.6 g/dL (ref 30.0–36.0)
MCHC: 34.6 g/dL (ref 30.0–36.0)
MCHC: 33.8 g/dL (ref 30.0–36.0)
MCV: 85.5 fL (ref 78.0–100.0)
MCV: 85.9 fL (ref 78.0–100.0)
MCV: 86 fL (ref 78.0–100.0)
PLATELETS: 141 10*3/uL — AB (ref 150–400)
Platelets: 139 10*3/uL — ABNORMAL LOW (ref 150–400)
Platelets: 127 10*3/uL — ABNORMAL LOW (ref 150–400)
RBC: 4.7 MIL/uL (ref 3.87–5.11)
RBC: 4.75 MIL/uL (ref 3.87–5.11)
RBC: 4.58 MIL/uL (ref 3.87–5.11)
RDW: 13.2 % (ref 11.5–15.5)
RDW: 13.3 % (ref 11.5–15.5)
RDW: 13.3 % (ref 11.5–15.5)
WBC: 13.8 10*3/uL — ABNORMAL HIGH (ref 4.0–10.5)
WBC: 16.2 10*3/uL — ABNORMAL HIGH (ref 4.0–10.5)
WBC: 17.3 10*3/uL — ABNORMAL HIGH (ref 4.0–10.5)

## 2013-07-15 LAB — RPR

## 2013-07-15 LAB — MRSA PCR SCREENING: MRSA by PCR: NEGATIVE

## 2013-07-15 MED ORDER — SENNOSIDES-DOCUSATE SODIUM 8.6-50 MG PO TABS
2.0000 | ORAL_TABLET | ORAL | Status: DC
  Administered 2013-07-15 – 2013-07-16 (×2): 2 via ORAL
  Filled 2013-07-15 (×3): qty 2

## 2013-07-15 MED ORDER — PHENYLEPHRINE 40 MCG/ML (10ML) SYRINGE FOR IV PUSH (FOR BLOOD PRESSURE SUPPORT)
80.0000 ug | PREFILLED_SYRINGE | INTRAVENOUS | Status: DC | PRN
  Filled 2013-07-15: qty 2

## 2013-07-15 MED ORDER — DIPHENHYDRAMINE HCL 50 MG/ML IJ SOLN: 12.5000 mg | INTRAMUSCULAR | Status: DC | PRN

## 2013-07-15 MED ORDER — MAGNESIUM SULFATE 40 G IN LACTATED RINGERS - SIMPLE: 2.0000 g/h | INTRAVENOUS | Status: DC

## 2013-07-15 MED ORDER — BENZOCAINE-MENTHOL 20-0.5 % EX AERO: 1.0000 "application " | INHALATION_SPRAY | CUTANEOUS | Status: DC | PRN

## 2013-07-15 MED ORDER — LACTATED RINGERS IV SOLN: INTRAVENOUS | Status: DC

## 2013-07-15 MED ORDER — LIDOCAINE-EPINEPHRINE (PF) 2 %-1:200000 IJ SOLN
INTRAMUSCULAR | Status: DC | PRN
  Administered 2013-07-15: 3 mL via EPIDURAL

## 2013-07-15 MED ORDER — LIDOCAINE HCL (PF) 1 % IJ SOLN
INTRAMUSCULAR | Status: DC | PRN
  Administered 2013-07-15 (×3): 5 mL

## 2013-07-15 MED ORDER — PHENYLEPHRINE 40 MCG/ML (10ML) SYRINGE FOR IV PUSH (FOR BLOOD PRESSURE SUPPORT)
80.0000 ug | PREFILLED_SYRINGE | INTRAVENOUS | Status: DC | PRN
  Filled 2013-07-15: qty 2
  Filled 2013-07-15: qty 10

## 2013-07-15 MED ORDER — FENTANYL 2.5 MCG/ML BUPIVACAINE 1/10 % EPIDURAL INFUSION (WH - ANES)
INTRAMUSCULAR | Status: DC | PRN
  Administered 2013-07-15: 14 mL/h via EPIDURAL

## 2013-07-15 MED ORDER — DIPHENHYDRAMINE HCL 25 MG PO CAPS: 25.0000 mg | ORAL_CAPSULE | Freq: Four times a day (QID) | ORAL | Status: DC | PRN

## 2013-07-15 MED ORDER — OXYCODONE-ACETAMINOPHEN 5-325 MG PO TABS
1.0000 | ORAL_TABLET | ORAL | Status: DC | PRN
  Administered 2013-07-15: 1 via ORAL
  Filled 2013-07-15: qty 1

## 2013-07-15 MED ORDER — ONDANSETRON HCL 4 MG PO TABS: 4.0000 mg | ORAL_TABLET | ORAL | Status: DC | PRN

## 2013-07-15 MED ORDER — IBUPROFEN 600 MG PO TABS: 600.0000 mg | ORAL_TABLET | Freq: Four times a day (QID) | ORAL | Status: DC

## 2013-07-15 MED ORDER — ZOLPIDEM TARTRATE 5 MG PO TABS: 5.0000 mg | ORAL_TABLET | Freq: Every evening | ORAL | Status: DC | PRN

## 2013-07-15 MED ORDER — EPHEDRINE 5 MG/ML INJ
10.0000 mg | INTRAVENOUS | Status: DC | PRN
  Filled 2013-07-15: qty 4
  Filled 2013-07-15: qty 2

## 2013-07-15 MED ORDER — OXYTOCIN 40 UNITS IN LACTATED RINGERS INFUSION - SIMPLE MED: 1.0000 m[IU]/min | INTRAVENOUS | Status: DC

## 2013-07-15 MED ORDER — EPHEDRINE 5 MG/ML INJ
10.0000 mg | INTRAVENOUS | Status: DC | PRN
  Filled 2013-07-15: qty 2

## 2013-07-15 MED ORDER — IBUPROFEN 600 MG PO TABS
600.0000 mg | ORAL_TABLET | Freq: Four times a day (QID) | ORAL | Status: DC
  Administered 2013-07-15 – 2013-07-17 (×8): 600 mg via ORAL
  Filled 2013-07-15 (×8): qty 1

## 2013-07-15 MED ORDER — MAGNESIUM SULFATE 40 G IN LACTATED RINGERS - SIMPLE
2.0000 g/h | INTRAVENOUS | Status: DC
  Administered 2013-07-15: 2 g/h via INTRAVENOUS
  Filled 2013-07-15 (×2): qty 500

## 2013-07-15 MED ORDER — LACTATED RINGERS IV SOLN
500.0000 mL | Freq: Once | INTRAVENOUS | Status: AC
  Administered 2013-07-15: 500 mL via INTRAVENOUS

## 2013-07-15 MED ORDER — MEASLES, MUMPS & RUBELLA VAC ~~LOC~~ INJ
0.5000 mL | INJECTION | Freq: Once | SUBCUTANEOUS | Status: AC
  Administered 2013-07-17: 0.5 mL via SUBCUTANEOUS
  Filled 2013-07-15 (×2): qty 0.5

## 2013-07-15 MED ORDER — LACTATED RINGERS IV SOLN
INTRAVENOUS | Status: DC
  Administered 2013-07-15: 17:00:00 via INTRAVENOUS

## 2013-07-15 MED ORDER — DEXTROSE 5 % IV SOLN
2.0000 g | Freq: Two times a day (BID) | INTRAVENOUS | Status: AC
  Administered 2013-07-15 (×2): 2 g via INTRAVENOUS
  Filled 2013-07-15 (×3): qty 2

## 2013-07-15 MED ORDER — ONDANSETRON HCL 4 MG/2ML IJ SOLN: 4.0000 mg | INTRAMUSCULAR | Status: DC | PRN

## 2013-07-15 MED ORDER — DIBUCAINE 1 % RE OINT: 1.0000 "application " | TOPICAL_OINTMENT | RECTAL | Status: DC | PRN

## 2013-07-15 MED ORDER — BUPIVACAINE HCL (PF) 0.25 % IJ SOLN
INTRAMUSCULAR | Status: DC | PRN
  Administered 2013-07-15: 3 mL

## 2013-07-15 MED ORDER — SIMETHICONE 80 MG PO CHEW: 80.0000 mg | CHEWABLE_TABLET | ORAL | Status: DC | PRN

## 2013-07-15 MED ORDER — LACTATED RINGERS IV SOLN
INTRAVENOUS | Status: DC
Start: 2013-07-15 — End: 2013-07-15

## 2013-07-15 MED ORDER — PRENATAL MULTIVITAMIN CH
1.0000 | ORAL_TABLET | Freq: Every day | ORAL | Status: DC
  Administered 2013-07-15 – 2013-07-16 (×2): 1 via ORAL
  Filled 2013-07-15 (×2): qty 1

## 2013-07-15 MED ORDER — TERBUTALINE SULFATE 1 MG/ML IJ SOLN: 0.2500 mg | Freq: Once | INTRAMUSCULAR | Status: DC | PRN

## 2013-07-15 MED ORDER — TETANUS-DIPHTH-ACELL PERTUSSIS 5-2.5-18.5 LF-MCG/0.5 IM SUSP
0.5000 mL | Freq: Once | INTRAMUSCULAR | Status: AC
Start: 2013-07-16 — End: 2013-07-16
  Administered 2013-07-16: 0.5 mL via INTRAMUSCULAR
  Filled 2013-07-15: qty 0.5

## 2013-07-15 MED ORDER — FENTANYL 2.5 MCG/ML BUPIVACAINE 1/10 % EPIDURAL INFUSION (WH - ANES)
14.0000 mL/h | INTRAMUSCULAR | Status: DC | PRN
  Filled 2013-07-15 (×2): qty 125

## 2013-07-15 MED ORDER — LANOLIN HYDROUS EX OINT: TOPICAL_OINTMENT | CUTANEOUS | Status: DC | PRN

## 2013-07-15 MED ORDER — WITCH HAZEL-GLYCERIN EX PADS: 1.0000 "application " | MEDICATED_PAD | CUTANEOUS | Status: DC | PRN

## 2013-07-15 NOTE — Progress Notes (Signed)
UR chart review completed.  

## 2013-07-15 NOTE — Progress Notes (Signed)
Patient ID: Misty PhillipsKatie Zuniga, female   DOB: 1990/11/10, 23 y.o.   MRN: [id number] Pt is contracting q 2-4 minutes and they are painful. We requested an epidural but the platelet count was 19K We are repeating it now. The cervix is 3 cm 70% effaced and the vertex is at - 2 station.

## 2013-07-15 NOTE — Progress Notes (Signed)
Patient ID: Misty PhillipsKatie Laurie, female   DOB: May 24, 1990, 23 y.o.   MRN: [id number] I was called at 4:48 AM because of bradycardia. The RN wanted permission to place a FSE. I arrived at 4:55 AM and there were deep variable decelerations with good variability On reviewing the strip bradycardia began at 4:31 AM and per the strip remained below 100 until I was called. However, the RN's in the room state there was intermittent recovery of the FHR that did not print.The cervix is 5 cm 70 % effaced and the vertex is at - 2 station. The pitocin was d/c ed during the bradycardia. She is currently on her right side and is continuing to have variable decelerations with good recovery and good variability.

## 2013-07-15 NOTE — Progress Notes (Signed)
CBG=163 done 2 hrs PP d/t being in NICU @1hr 

## 2013-07-15 NOTE — Lactation Note (Signed)
This note was copied from the chart of [name]. Lactation Consultation Note     Initial consult with this mom of a NICU baby, now 4 hours old, 33 1/[redacted] weeks gestation. This is mom's second baby, and second premie. Her first child is 25 years old, and mom reports a low milk supply, and said she did not pump at home, only when she visited the baby in the hospital. I reviewed with mom the NICU booklet, and stressed the importance of every 3 hours pumping, and the importance of the first 2 weeks. Mom is getting a DEP. This may be contri from her cousin. Mom lives an hour away, and does not drive a car. She depends on her 25 year old grandmother-in-law. Dad is a Naval architecttruck driver, and is away a lot due to this. 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  I did show mom how to hand express - she will need more encouragement and practice with this. I collected about 0.5 - 1 mls of colostrum to bring to [name].  Mom knows to call for questions/concerns  Patient Name: [name] ZOXWR'UToday's Date: 07/15/2013 Reason for consult: Initial assessment;Late preterm infant;Infant < 6lbs;NICU baby   Maternal Data Formula Feeding for Exclusion: Yes Reason for exclusion: Admission to Intensive Care Unit (ICU) post-partum (baby in NICU and mom in AICU) Infant to breast within first hour of birth: No Breastfeeding delayed due to:: Infant status Has patient been taught Hand Expression?: Yes Does the patient have breastfeeding experience prior to this delivery?: Yes  Feeding    LATCH Score/Interventions                      Lactation Tools Discussed/Used Tools: Pump Breast pump type: Double-Electric Breast Pump WIC Program: No Pump Review: Milk Storage;Other (comment);Setup, frequency, and cleaning (HE, review of NICU booklet , premie setting) Initiated by:: clee RN at 1230 pm, at 4  hours post partum Date initiated:: 07/15/13   Consult Status Consult Status: Follow-up Date: 07/16/13 Follow-up type: In-patient    [name] 07/15/2013, 1:03 PM

## 2013-07-15 NOTE — Progress Notes (Signed)
Patient ID: Misty PhillipsKatie Shehadeh, female   DOB: 1990/10/13, 23 y.o.   MRN: [id number] Pitocin at 9 mu/ minute and contractions are not tracing. The cervix is 2 cm 70 % effaced and the vertex is at - 2 station AROM produced clear fluid.

## 2013-07-15 NOTE — Progress Notes (Signed)
Patient ID: Misty PhillipsKatie Estey, female   DOB: 1990-06-02, 23 y.o.   MRN: [id number] Thr repeated platelet count is 141K

## 2013-07-15 NOTE — Progress Notes (Signed)
Patient ID: Misty PhillipsKatie Zuniga, female   DOB: 07-20-1990, 23 y.o.   MRN: [id number]  Pt had bradycardia and variable decels, now improved.  Pitocin off.  Pt with pressure.  AF BP mildly elevated. gen NAD FHTs 120's, category 1-2 toco occ  IUPC placed, w/o diff/comp  SVE 7/80/0  23yo G2 P0101 at 33+ Expect SVD amninfusuion prn Gentle ind/aug with pitocin

## 2013-07-15 NOTE — Progress Notes (Signed)
Unspiked bag of fentanyl/bupivicaine returned to [facility] in pharmacy to return to stock.

## 2013-07-15 NOTE — Anesthesia Procedure Notes (Signed)
Epidural Patient location during procedure: OB  Staffing Anesthesiologist: Evony Rezek Performed by: anesthesiologist   Preanesthetic Checklist Completed: patient identified, site marked, surgical consent, pre-op evaluation, timeout performed, IV checked, risks and benefits discussed and monitors and equipment checked  Epidural Patient position: sitting Prep: ChloraPrep Patient monitoring: heart rate, continuous pulse ox and blood pressure Approach: right paramedian Location: L3-L4 Injection technique: LOR saline  Needle:  Needle type: Tuohy  Needle gauge: 17 G Needle length: 9 cm and 9 Needle insertion depth: 7 cm Catheter type: closed end flexible Catheter size: 20 Guage Catheter at skin depth: 11 cm Test dose: negative  Assessment Events: blood not aspirated, injection not painful, no injection resistance, negative IV test and no paresthesia  Additional Notes   Patient tolerated the insertion well without complications.   

## 2013-07-15 NOTE — Anesthesia Preprocedure Evaluation (Addendum)
Anesthesia Evaluation  Patient identified by MRN, date of birth, ID band Patient awake    Reviewed: Allergy & Precautions, H&P , NPO status , Patient's Chart, lab work & pertinent test results  History of Anesthesia Complications Negative for: history of anesthetic complications  Airway Mallampati: II TM Distance: >3 FB Neck ROM: full    Dental no notable dental hx. (+) Teeth Intact   Pulmonary neg pulmonary ROS,  breath sounds clear to auscultation  Pulmonary exam normal       Cardiovascular hypertension, Pt. on medications negative cardio ROS  Rhythm:regular Rate:Normal     Neuro/Psych negative neurological ROS  negative psych ROS   GI/Hepatic negative GI ROS, Neg liver ROS,   Endo/Other  negative endocrine ROSdiabetes, GestationalMorbid obesity  Renal/GU negative Renal ROS  negative genitourinary   Musculoskeletal   Abdominal Normal abdominal exam  (+)   Peds  Hematology negative hematology ROS (+)   Anesthesia Other Findings   Reproductive/Obstetrics (+) Pregnancy Pre-ecclampsia                          Anesthesia Physical Anesthesia Plan  ASA: III  Anesthesia Plan: Epidural   Post-op Pain Management:    Induction:   Airway Management Planned:   Additional Equipment:   Intra-op Plan:   Post-operative Plan:   Informed Consent: I have reviewed the patients History and Physical, chart, labs and discussed the procedure including the risks, benefits and alternatives for the proposed anesthesia with the patient or authorized representative who has indicated his/her understanding and acceptance.     Plan Discussed with:   Anesthesia Plan Comments:         Anesthesia Quick Evaluation

## 2013-07-15 NOTE — Consult Note (Signed)
[facility]  Delivery Note:  Vaginal Birth        07/15/2013  9:02 AM  I was called to Labor and Delivery at request of the patient's obstetrician (Dr. Hinton RaoBovard-Stuckert) due to IOL at 6633 and 1/[redacted] weeks gestation for worsening pre-eclampsia.    PRENATAL HX:  23 y/o G2P1 at 1333 and 1/[redacted] weeks gestation.  Pregnancy complicated by pre-eclampsia and GDM (diet controlled).  She has a 25 year old son who was born at 6832 weeks and [facility].    INTRAPARTUM HX:  She was admitted for 07/09/13 for worsening pre-eclampsia with hypertension, headache, proteinuria, and shortness of breath.  She has also had some mild thrombocytopenia.  Induction started yesterday.    DELIVERY:  Infant was vigorous at delivery, requiring no resuscitation other than standard warming, drying and stimulation.  APGARs 8 and 9.  He had O2 saturations in the low 90s by 5 minutes of life.  Exam was within normal limits.  Will admit to NICU for IV fluids, monitoring, and temperature support.    ____________________ Electronically Signed By: Maryan CharLindsey Dinisha Cai

## 2013-07-15 NOTE — Progress Notes (Signed)
Patient ID: Misty PhillipsKatie Zuniga, female   DOB: 13-Jul-1990, 23 y.o.   MRN: [id number] Pt is on 15 mu/ minute of pitocin and contractions are somewhat irregular She has an epidural and does not feel the contractions The cervix is 3 cm 70 % effaced and the vertex is at - 2 station.

## 2013-07-16 LAB — COMPREHENSIVE METABOLIC PANEL
ALT: 20 U/L (ref 0–35)
AST: 16 U/L (ref 0–37)
Albumin: 1.4 g/dL — ABNORMAL LOW (ref 3.5–5.2)
Alkaline Phosphatase: 70 U/L (ref 39–117)
BUN: 13 mg/dL (ref 6–23)
CALCIUM: 6.9 mg/dL — AB (ref 8.4–10.5)
CHLORIDE: 102 meq/L (ref 96–112)
CO2: 25 mEq/L (ref 19–32)
CREATININE: 0.71 mg/dL (ref 0.50–1.10)
Glucose, Bld: 91 mg/dL (ref 70–99)
Potassium: 4.4 mEq/L (ref 3.7–5.3)
Sodium: 137 mEq/L (ref 137–147)
Total Protein: 4.1 g/dL — ABNORMAL LOW (ref 6.0–8.3)

## 2013-07-16 LAB — CBC
HEMATOCRIT: 34.9 % — AB (ref 36.0–46.0)
HEMOGLOBIN: 11.8 g/dL — AB (ref 12.0–15.0)
MCH: 29.2 pg (ref 26.0–34.0)
MCHC: 33.8 g/dL (ref 30.0–36.0)
MCV: 86.4 fL (ref 78.0–100.0)
Platelets: 132 10*3/uL — ABNORMAL LOW (ref 150–400)
RBC: 4.04 MIL/uL (ref 3.87–5.11)
RDW: 13.8 % (ref 11.5–15.5)
WBC: 11.8 10*3/uL — ABNORMAL HIGH (ref 4.0–10.5)

## 2013-07-16 LAB — GLUCOSE, CAPILLARY: GLUCOSE-CAPILLARY: 81 mg/dL (ref 70–99)

## 2013-07-16 NOTE — Progress Notes (Signed)
Pt going back to NICU - states she feels a little better now.

## 2013-07-16 NOTE — Anesthesia Postprocedure Evaluation (Signed)
  Anesthesia Post-op Note  Patient: Misty PhillipsKatie Zuniga  Procedure(s) Performed: * No procedures listed *  Patient Location: PACU and A-ICU  Anesthesia Type:Epidural  Level of Consciousness: awake, alert  and oriented  Airway and Oxygen Therapy: Patient Spontanous Breathing  Post-op Pain: mild  Post-op Assessment: Patient's Cardiovascular Status Stable, Respiratory Function Stable, Patent Airway, No signs of Nausea or vomiting and Pain level controlled  Post-op Vital Signs: Reviewed and stable  Last Vitals:  Filed Vitals:   07/16/13 1505  BP:   Pulse: 95  Temp:   Resp:     Complications: No apparent anesthesia complications

## 2013-07-16 NOTE — Progress Notes (Signed)
Pt. Without c/o at this time,Foley d/c'd, plan of care discussed

## 2013-07-16 NOTE — Progress Notes (Signed)
Post Partum Day 1 Subjective: no complaints, up ad lib, voiding, tolerating PO and nl lochia, pain controlled.  Good uop (2,569+ through 24 hr period)  Objective: Blood pressure 134/90, pulse 78, temperature 98 F (36.7 C), temperature source Oral, resp. rate 18, height 4\' 8"  (1.422 m), weight 89.268 kg (196 lb 12.8 oz), last menstrual period 11/25/2012, SpO2 97.00%, unknown if currently breastfeeding.  Physical Exam:  General: alert and no distress Lochia: appropriate Uterine Fundus: firm  Recent Labs  07/15/13 0925 07/16/13 0540  HGB 13.3 11.8*  HCT 39.4 34.9*    Assessment/Plan: Plan for discharge tomorrow, Breastfeeding and Lactation consult.  Magnesium Sulfate turned off transfer to MB.     LOS: 7 days   Owin Vignola Bovard-Stuckert 07/16/2013, 7:46 AM

## 2013-07-16 NOTE — Progress Notes (Signed)
Pt returned from NICU c/o feeling dizzy & weak.   07/16/13 1502  Vitals  BP ! 158/96 mmHg  MAP (mmHg) 112  Pulse Rate 91  Resp 18  Oxygen Therapy  SpO2 99 %  O2 Device None (Room air)  Denies headache, blurred vision or other pain. CBG=81. Pt has not eaten since breakfast & has not had more than 2 cups to drink today. Gave pt crackers w/peanut butter, & juice. Encouraged her to order a meal & increase fluid intake. Awaiting transfer to [facility] when bed available.

## 2013-07-16 NOTE — Progress Notes (Signed)
Pt ambulated to [facility] RM#[id number]

## 2013-07-17 ENCOUNTER — Encounter (HOSPITAL_COMMUNITY): Payer: Self-pay | Admitting: *Deleted

## 2013-07-17 MED ORDER — LABETALOL HCL 5 MG/ML IV SOLN
20.0000 mg | Freq: Once | INTRAVENOUS | Status: AC
  Administered 2013-07-17: 20 mg via INTRAVENOUS
  Filled 2013-07-17: qty 4

## 2013-07-17 MED ORDER — PRENATAL MULTIVITAMIN CH: 1.0000 | ORAL_TABLET | Freq: Every day | ORAL | Status: AC

## 2013-07-17 MED ORDER — OXYCODONE-ACETAMINOPHEN 5-325 MG PO TABS: 1.0000 | ORAL_TABLET | Freq: Four times a day (QID) | ORAL | Status: AC | PRN

## 2013-07-17 MED ORDER — IBUPROFEN 800 MG PO TABS: 800.0000 mg | ORAL_TABLET | Freq: Three times a day (TID) | ORAL | Status: DC | PRN

## 2013-07-17 NOTE — Lactation Note (Signed)
This note was copied from the chart of Misty Eli PhillipsKatie Zuniga. Lactation Consultation Note   Follow up consult with this mom of a NICU baby, now 50 hours post partum. Mom is being discharged to home today. She has some scabs on her areolas from pumping flanges that were too large, and I decreased her to size 21, with a much better fit and comfort.  I had mom pump for 15 minutes in the standard setting, and she was able to express colostrum from both breast, about 1 ml. She was happy to see this. i explained that she is just 50 hours pp, and that she has bee stressed and sick , and is still very edematous. I encouraged her to keep pumping at home, for at least 15 minutes every 3 hours, and hopefully she will se her milk transition in in the next couple of days. I gave dad the name and [place] of [place], and encouraged parents to call and make an appointment to apply. Financially they  should have no problem qualifying, and this will be a good resource for this family. Mom  Does had a Medela PIS and I showed her how to use it, and how to use the sterilization bags. i advised her to sterilize her parts before using, since the pump and parts are used. Mom  Hopes to be able to visit the baby every other day. Her 668 year old grandmother in law will be bring her, since dad is a truck driver, and [place] to [place], and is gone for a week at [employer]. I will follow this family in the NICU.  Patient Name: Misty Eli PhillipsKatie Zuniga [id number]'s Date: 07/17/2013 Reason for consult: Follow-up assessment;NICU baby;Infant < 6lbs;Late preterm infant   Maternal Data    Feeding    LATCH Score/Interventions                      Lactation Tools Discussed/Used     Consult Status Consult Status: Follow-up Follow-up type: In-patient (NICU)    Alfred LevinsChristine Anne Dinia Joynt 07/17/2013, 11:15 AM

## 2013-07-17 NOTE — Discharge Summary (Signed)
Obstetric Discharge Summary Reason for Admission: PreEclampsia/monitoring Prenatal Procedures: NST, labs Intrapartum Procedures: spontaneous vaginal delivery Postpartum Procedures: antibiotics Complications-Operative and Postpartum: none and Magnesium sulfate Hemoglobin  Date Value Ref Range Status  07/16/2013 11.8* 12.0 - 15.0 g/dL Final     HCT  Date Value Ref Range Status  07/16/2013 34.9* 36.0 - 46.0 % Final    Physical Exam:  General: alert and no distress Lochia: appropriate Uterine Fundus: firm  Discharge Diagnoses: Preelampsia and preterm vaginal delivery after IOL for worsening PreE  Discharge Information: Date: 07/17/2013 Activity: pelvic rest Diet: routine Medications: PNV, Ibuprofen and Percocet Condition: stable Instructions: refer to practice specific booklet Discharge to: home Follow-up Information   Follow up with Bovard-Stuckert, Kobe Jansma, MD. Schedule an appointment as soon as possible for a visit in 2 weeks. (for BP check, 6 weeks for postpartum check)    Specialty:  Obstetrics and Gynecology   Contact information:   [address] [place] [id number] [phone]       Newborn Data: Live born female  Birth Weight: 3 lb (1361 g) APGAR: 8, 9  In NICU at maternal discharge.  Augusto Gamble.  Cristy Colmenares Bovard-Stuckert 07/17/2013, 7:40 AM

## 2013-07-17 NOTE — Lactation Note (Signed)
This note was copied from the chart of Misty Eli PhillipsKatie Zuniga. Lactation Consultation Note    Follow up consult with this mom of a NICU baby, now 6436 hours old, and 33 3/7 weeks corrected gestation. Mom is able to express only drops of colostrum. i reviewed hand expression with her, and told her to try and be patient, that her milk should be transitioning in in the next day or two. I spoke to Misty Zuniga, from Misty Zuniga. She is going to speak to mom and dad tomorrow, and set up an appointment for mom to apply for Misty Zuniga. Mom was able to get her cousins DEP(Medela), so is much relieved about that. I will follow this family in the nICU.  Patient Name: Misty Eli PhillipsKatie Zuniga [id number]'s Date: 07/17/2013     Maternal Data    Feeding    LATCH Score/Interventions                      Lactation Tools Discussed/Used     Consult Status      Misty LevinsChristine Anne Loys Zuniga 07/17/2013, 9:07 AM

## 2013-07-17 NOTE — Progress Notes (Signed)
Ur chart review completed per request.  

## 2013-07-17 NOTE — Progress Notes (Signed)
Pt is discharged in the care of husband. Downstairs per ambulatory. Denies any pain or discomfort. Discharge instructions with Rx were given to pt. Questions were asked and answered. Infant to remain in [place].No equipment needed for home use.Escorted downstairs by N.T.

## 2013-07-17 NOTE — Progress Notes (Signed)
CSW attempted again to meet with parents to complete assessment, but they were meeting with lactation at this time.  Bedside RN offered to call CSW when parents are available.

## 2013-07-17 NOTE — Progress Notes (Signed)
Post Partum Day 2 Subjective: no complaints, up ad lib, voiding, tolerating PO and nl lochia, pain controlled.  no PIH sx's.  Mother seen in NICU.    Objective: Blood pressure 151/97, pulse 82, temperature 97.9 F (36.6 C), temperature source Oral, resp. rate 20, height 4\' 8"  (1.422 m), weight 84.709 kg (186 lb 12 oz), last menstrual period 11/25/2012, SpO2 98.00%, unknown if currently breastfeeding.  Physical Exam:  General: alert and no distress Lochia: appropriate Uterine Fundus: firm   Recent Labs  07/15/13 0925 07/16/13 0540  HGB 13.3 11.8*  HCT 39.4 34.9*    Assessment/Plan: Discharge home, Breastfeeding and Lactation consult.  D/c with motrin/percocet/pnv.  F/u 2 weeks for incision check   LOS: 8 days   Misty Zuniga 07/17/2013, 7:36 AM

## 2013-07-17 NOTE — Progress Notes (Signed)
CSW attempted to meet with parents to complete assessment due to NICU admission, but FOB stated MOB was pumping and requested that CSW return a little later.

## 2013-07-18 NOTE — Progress Notes (Signed)
Clinical Social Work Department PSYCHOSOCIAL ASSESSMENT - MATERNAL/CHILD August 20, 2013-Late Entry  Patient:  Misty Zuniga, Misty Zuniga  Account Number:  [id number]  Admit Date:  07/09/2013  Misty Zuniga Name:   Misty Zuniga    Clinical Social Worker:  Terri Piedra, LCSW   Date/Time:  09/14/2013 01:00 PM  Date Referred:        Other referral source:   No referral-NICU admission-33 weeks    I:  FAMILY / HOME ENVIRONMENT Child's legal guardian:  PARENT  Guardian - Name Guardian - Age Guardian - Address  Misty Zuniga [address] [place], [place] [address]  Misty Zuniga  same   Other household support members/support persons Name Relationship DOB  Misty Zuniga [facility] 05/18/10   Other support:   Parents report that they have a very good support system. FOB states his grandmother lives 5 miles away and helps with transportation.  MOB's parents live in [place] and are involved and supportive.  FOB states they have many neighbors who they can call if they need anything.    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  MOB is a stay at home mother.   Financial resources:  Multimedia programmer If Agency:    School / Grade:   Maternity Care Coordinator / Child Services Coordination / Early Interventions:   [place]  Cultural issues impacting care:   None stated    III  STRENGTHS Strengths  Adequate Resources  Compliance with medical plan  Home prepared for Child (including basic supplies)  Other - See comment  Supportive family/friends  Understanding of illness   Strength comment:  Pediatric follow up will be at [facility] with Dr. Doloris Hall   IV  RISK FACTORS AND CURRENT PROBLEMS Current Problem:  None   Risk Factor & Current Problem Patient Issue Family Issue Risk Factor / Current Problem Comment   N N     V  SOCIAL WORK ASSESSMENT  CSW attempted to  meet with parents numerous times to complete assessment due to NICU admission of their son at 78 weeks, but had a difficult time connecting with them while MOB was inpatient.  45 bedside RN contacted CSW that parents were at Misty bedside prior to going home today.  CSW met with parents at bedside to introduce myself and complete assessment.  Both parents were very friendly and welcoming of CSW's visit.  They report coping well with Misty situation at this time and state their first son was also born prematurely and admitted to Misty NICU at birth.  MOB states she was hopeful that it would not happen again, but was not surprised, when she started having Misty same symptoms that put her in Misty hospital and forced her to deliver early.  Parents state that their first son, Misty Zuniga, was born at 3 weeks and stayed in Misty NICU for 3 weeks.  They report having a good support system.  MOB states her parents are caring for their 25 year old while they have been in Misty hospital.  MOB does not drive because FOB states he cannot afford Misty insurance and another vehicle.  He reports that his grandmother and MOB's parents help them with transportation while he is on Misty road.  He states he is a Administrator and is gone 6-8 days at a time and then home for 2 before leaving again.  He states MOB has a lot of  people she can call if she needs help at home while he is gone.  While we were talking, MOB was called to go back to her room to have her PICC line removed.  CSW continued assessment with FOB who was very willing to keep talking.  CSW asked FOB how he feels he and MOB are coping emotionally with another NICU admission.  He states Misty Zuniga is doing better than Misty Zuniga at this point, which so far has lessened their anxieties.  He thinks they are doing well.  He states he is slightly concerned about MOB being at home with two children because she gets overwhelmed with one.  He again added, however, that she has many people she can call  on.  He states they try to make well check visits at Misty doctor for days he is home and all of their doctors are in [place].  If he is not home when there is an appointment, his grandmother or MOB's parents take MOB to Misty appointment.  Dr. Doloris Hall is their pediatrician.  FOB states family will be able to bring MOB to Misty hospital daily to be with baby and will be able to care for Misty Zuniga so MOB can visit, although he added that their home is approximately an hour from Misty hospital.  CSW asked if it was a possibility for MOB to stay with her parents in [place] while baby is in Misty hospital so she can be closer.  He states their home is small and already has many people living in it, so Misty Zuniga would not be comfortable staying there.  He states MOB has a 25 year old sister at home with her parents as well as a 25 year sister with significant cognitive delays, which he states resulted from lack of oxygen at birth.  He is confident MOB will have transportation, but gladly accepted gas cards that CSW offered him via [employer].  CSW discussed signs and symptoms of PPD to help MOB watch for and asked him to encourage MOB to talk with her doctor and or CSW if she has concerns about her emotions at any time.  CSW would like to talk with MOB about this as well and will attempt to when MOB is here visiting baby.  FOB states they have Misty necessary baby items at home for Misty Zuniga and states no questions, concerns or needs at this time.  CSW explained ongoing support services offered by NICU CSW and gave contact information.  FOB was appreciative.    VI SOCIAL WORK PLAN Social Work Therapist, art  Psychosocial Support/Ongoing Assessment of Needs   Type of pt/family education:   Ongoing support services offered by NICU CSW  Family Support Network services for NICU families  PPD signs and symptoms   If child protective services report - county:   If child protective  services report - date:   Information/referral to community resources comment:   Family Support Network   Other social work plan:

## 2013-08-05 ENCOUNTER — Ambulatory Visit: Payer: Self-pay

## 2013-08-05 NOTE — Lactation Note (Signed)
This note was copied from the chart of Boy Carnita Stockstill. Lactation Consultation Note  Patient Name: Boy Tammy Sayne [id number] Date: 08/05/2013 Reason for consult: Follow-up assessment;NICU baby;Infant < 6lbs;Late preterm infant  Called to NICU by RN to see mom with questions.  Mom reports low milk supply.  Infant is 61 weeks old and mom is pumping 30 ml with each pumping with a max of 60 ml for one morning pumping; total production 270 ml/day with 8-10 pumpings per day using Medela DEBP for 15-25 minutes each pumping session.  Reviewed with mom NICU booklet and minimum average milk production after 67 weeks of age as 750+ml milk production.  Discussed strategies with mom to help increase milk production such as hand-on pumping technique and hand expression at end of each pumping session.  Encouraged mom to discuss with her MD to have thyroid levels checked.  Fenugreek and Moringa galactogogue handouts given in addition to recipes for lactation cookies.  Encouraged mom to use NICU pumping log and to begin recording pumping sessions and record any galactogogue supplements taking to see if suggested strategies increase milk production.  Encouraged mom to follow-up with NICU LC after implementing suggestions and keeping log book.    Claiborne Rigg Petina Muraski 08/05/2013, 3:57 PM

## 2013-08-19 ENCOUNTER — Ambulatory Visit: Payer: Self-pay

## 2013-08-19 NOTE — Lactation Note (Signed)
This note was copied from the chart of Misty Zuniga. Lactation Consultation Note    Follow up consult with this mom of a NICU baby, now 66 weeks old and 38 1/7 weeks CGA. Mom latched baby in football hold, with good latch, with assistance of NICU nurse, Jacki Cones fields. I showed mom how to check for bottom lip flange, and to place her hand behind baby's back, to protect his airway. Mom is expressing only 30 mls at a time, and knows to supplement  with formula PC. I will f/u with mom tomorrow afternoon,  And do a pre and post weight, and do discharge teaching on lactation with mom.  Patient Name: Misty Abril Needler [id number] Date: 08/19/2013 Reason for consult: Follow-up assessment;NICU baby   Maternal Data    Feeding Feeding Type: Breast Fed Nipple Type: Slow - flow Length of feed: 20 min  LATCH Score/Interventions Latch: Grasps breast easily, tongue down, lips flanged, rhythmical sucking.  Audible Swallowing: A few with stimulation  Type of Nipple: Everted at rest and after stimulation  Comfort (Breast/Nipple): Soft / non-tender     Hold (Positioning): Assistance needed to correctly position infant at breast and maintain latch. Intervention(s): Breastfeeding basics reviewed;Support Pillows;Position options;Skin to skin  LATCH Score: 8  Lactation Tools Discussed/Used     Consult Status Consult Status: PRN Follow-up type: In-patient (NICU)    Alfred Levins 08/19/2013, 3:41 PM

## 2014-01-13 ENCOUNTER — Encounter (HOSPITAL_COMMUNITY): Payer: Self-pay | Admitting: *Deleted

## 2014-10-19 IMAGING — CR DG CHEST 1V PORT
1 series · 1 of 1 positions shown · non-contrast
Comparison: 05/15/2010 chest radiograph

CLINICAL DATA: PICC line placement.

EXAM:
PORTABLE CHEST - 1 VIEW

[view not recorded]
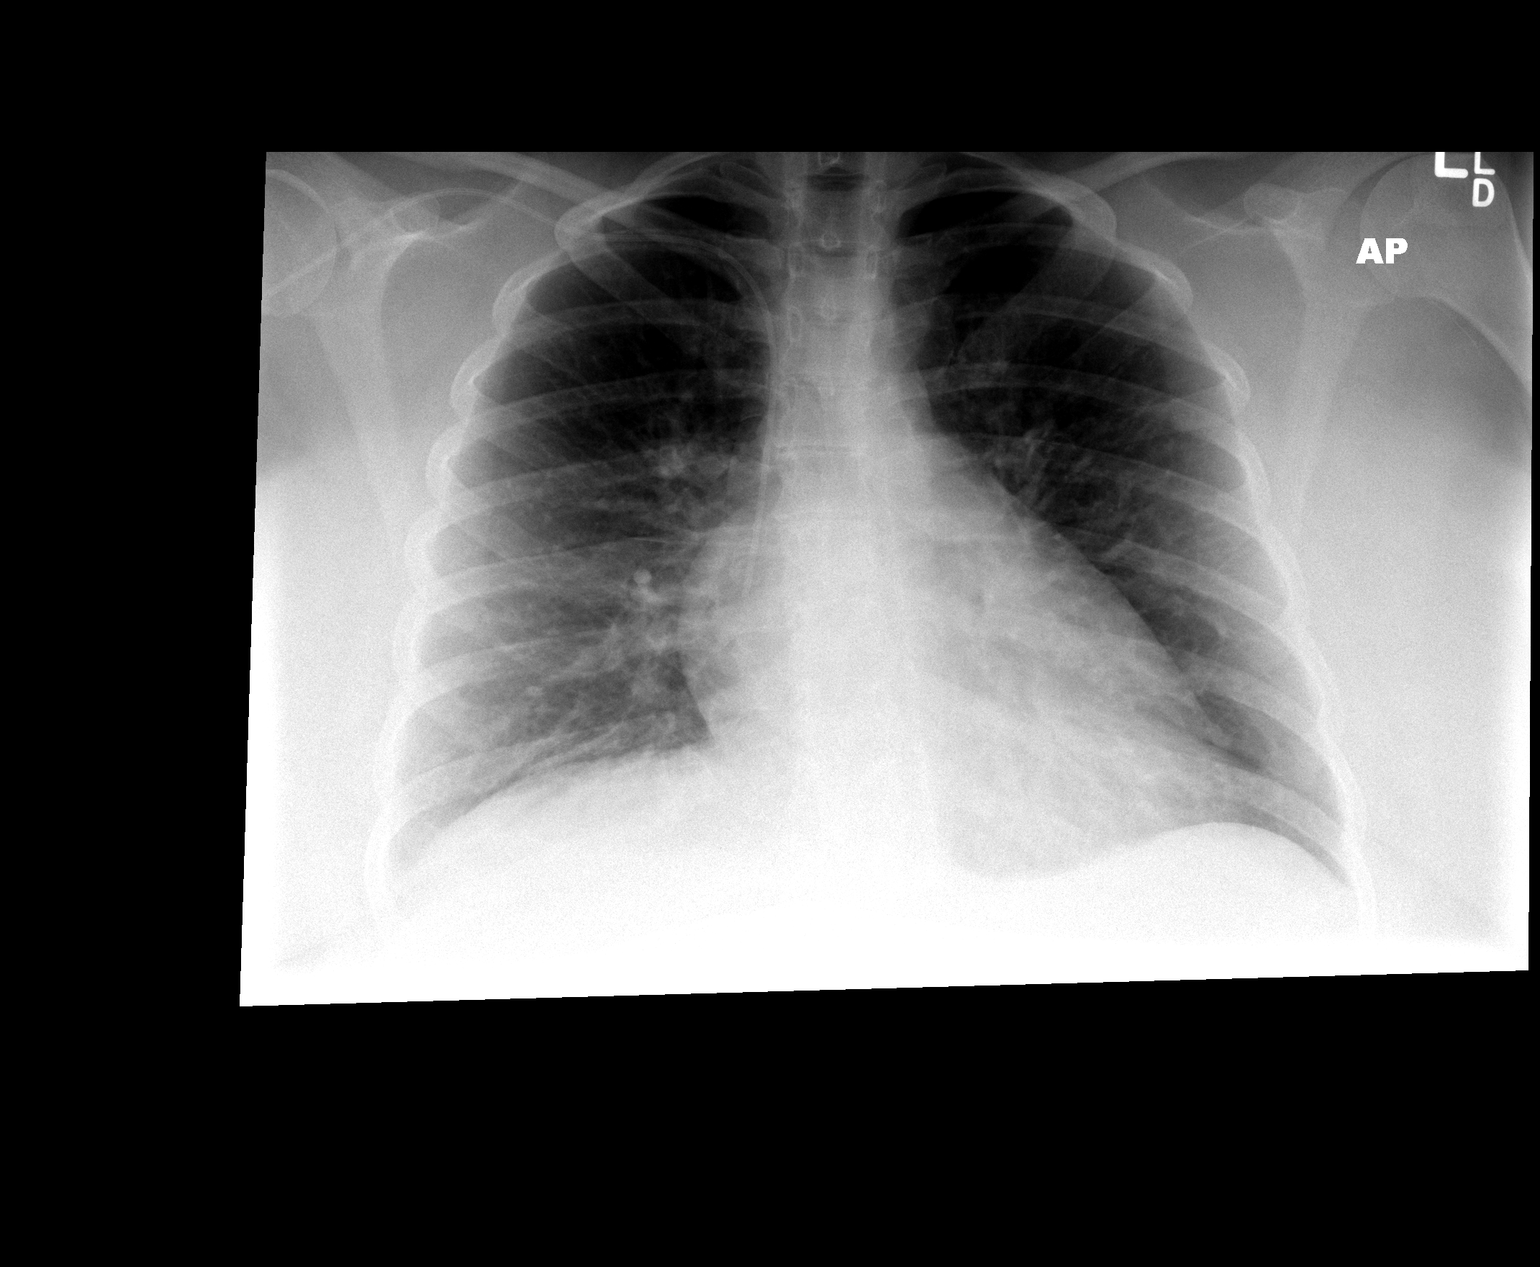

[1 of 1 positions shown; findings below may reference images not displayed]

FINDINGS: Upper limits normal heart size again noted.

A right PICC line is present with tip overlying the cavoatrial
junction.

Mild peribronchial thickening is unchanged.

There is no evidence of focal airspace disease, pulmonary edema,
suspicious pulmonary nodule/mass, pleural effusion, or pneumothorax.
No acute bony abnormalities are identified.
IMPRESSION: PICC line with tip overlying the cavoatrial junction.

Upper limits normal heart size and mild peribronchial thickening
again noted.

## 2016-02-08 ENCOUNTER — Encounter (HOSPITAL_COMMUNITY): Payer: Self-pay | Admitting: *Deleted

## 2016-02-08 ENCOUNTER — Emergency Department (HOSPITAL_COMMUNITY)
Admission: EM | Admit: 2016-02-08 | Discharge: 2016-02-08 | Disposition: A | Discharge status: Home / Self Care | Payer: Self-pay | Attending: Emergency Medicine | Admitting: Emergency Medicine

## 2016-02-08 ENCOUNTER — Emergency Department (HOSPITAL_COMMUNITY): Payer: Self-pay

## 2016-02-08 DIAGNOSIS — R05 Cough: Secondary | ICD-10-CM

## 2016-02-08 DIAGNOSIS — R059 Cough, unspecified: Secondary | ICD-10-CM

## 2016-02-08 DIAGNOSIS — J020 Streptococcal pharyngitis: Secondary | ICD-10-CM | POA: Insufficient documentation

## 2016-02-08 DIAGNOSIS — I10 Essential (primary) hypertension: Secondary | ICD-10-CM | POA: Insufficient documentation

## 2016-02-08 LAB — RAPID STREP SCREEN (MED CTR MEBANE ONLY): STREPTOCOCCUS, GROUP A SCREEN (DIRECT): POSITIVE — AB

## 2016-02-08 MED ORDER — BENZONATATE 100 MG PO CAPS: 100.0000 mg | ORAL_CAPSULE | Freq: Three times a day (TID) | ORAL | 0 refills | Status: DC | PRN

## 2016-02-08 MED ORDER — PENICILLIN G BENZATHINE 1200000 UNIT/2ML IM SUSP
1.2000 10*6.[IU] | Freq: Once | INTRAMUSCULAR | Status: AC
  Administered 2016-02-08: 1.2 10*6.[IU] via INTRAMUSCULAR
  Filled 2016-02-08: qty 2

## 2016-02-08 MED ORDER — IBUPROFEN 800 MG PO TABS: 800.0000 mg | ORAL_TABLET | Freq: Three times a day (TID) | ORAL | 0 refills | Status: AC

## 2016-02-08 NOTE — ED Provider Notes (Signed)
[facility]PT Provider Note   CSN: [id number] Arrival date & time: 02/08/16  0827  By signing my name below, I, Placido Sou, attest that this documentation has been prepared under the direction and in the presence of Cheri Fowler, PA-C. Electronically Signed: Placido Sou, ED Scribe. 02/08/16. 9:32 AM.   History   Chief Complaint Chief Complaint  Patient presents with  . URI    HPI HPI Comments: Misty Zuniga is a 25 y.o. female who presents to the Emergency Department complaining of constant, moderate, productive cough with green/yellow sputum x 3 days. She reports associated fever (TMAX 101 F yesterday and 99.2 F in triage), chills, body aches, sore throat, rhinorrhea and mild bilateral anterior neck tenderness. She has taken robitussin which provides mild short term relief. She is not a smoker. She states her husband was recently dx with bronchitis. She denies ear pain or any other associated symptoms at this time.   The history is provided by the patient. No language interpreter was used.    Past Medical History:  Diagnosis Date  . Hypertension   . Medical history non-contributory   . SVD (spontaneous vaginal delivery) 07/15/2013    Patient Active Problem List   Diagnosis Date Noted  . SVD (spontaneous vaginal delivery) 07/15/2013  . Pre-eclampsia, mild, third trimester 07/09/2013    Past Surgical History:  Procedure Laterality Date  . 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 Authorizing Provider  acetaminophen (TYLENOL) 325 MG tablet Take 650 mg by mouth every 6 (six) hours as needed for headache.    Historical Provider, MD  benzonatate (TESSALON) 100 MG capsule Take 1 capsule (100 mg total) by mouth 3 (three)  times daily as needed for cough. 02/08/16   Cheri Fowler, PA-C  ibuprofen (ADVIL,MOTRIN) 800 MG tablet Take 1 tablet (800 mg total) by mouth 3 (three) times daily. 02/08/16   Cheri Fowler, PA-C  oxyCODONE-acetaminophen (PERCOCET/ROXICET) 5-325 MG per tablet Take 1-2 tablets by mouth every 6 (six) hours as needed for severe pain (moderate - severe pain). 07/17/13   Sherian Rein, MD  Prenatal Vit-Fe Fumarate-FA (PRENATAL MULTIVITAMIN) TABS tablet Take 1 tablet by mouth daily at 12 noon. 07/17/13   Sherian Rein, MD    Family History Family History  Problem Relation Age of Onset  . Diabetes Mother   . Diabetes Maternal Aunt     Social History Social History  Substance Use Topics  . Smoking status: Never Smoker  . Smokeless tobacco: Never Used  . Alcohol use No     Allergies   Patient has no known allergies.   Review of Systems Review of Systems  Constitutional: Positive for chills and fever.  HENT: Positive for congestion, rhinorrhea and sore throat. Negative for ear discharge and ear pain.   Respiratory: Positive for cough.   Musculoskeletal: Positive for myalgias.   Physical Exam Updated Vital Signs BP 157/95 (BP Location: Left Arm)   Pulse 113   Temp 99.2 F (37.3 C) (Oral)   Resp 18   Ht 4' (1.219 m)   Wt 90.7 kg  LMP 02/07/2014   SpO2 99%   BMI 61.03 kg/m   Physical Exam  Constitutional: She is oriented to person, place, and time. She appears well-developed and well-nourished. She is active.  Non-toxic appearance. She does not have a sickly appearance. She does not appear ill.  HENT:  Head: Normocephalic and atraumatic.  Right Ear: Tympanic membrane and external ear normal. Tympanic membrane is not erythematous and not bulging.  Left Ear: Tympanic membrane and external ear normal. Tympanic membrane is not erythematous and not bulging.  Nose: Nose normal.  Mouth/Throat: Uvula is midline, oropharynx is clear and moist and mucous membranes are normal. No  trismus in the jaw. No uvula swelling. No oropharyngeal exudate, posterior oropharyngeal edema, posterior oropharyngeal erythema or tonsillar abscesses. Tonsils are 2+ on the right. Tonsils are 2+ on the left.  Neck: Normal range of motion. Neck supple.  No nuchal rigidity.   Cardiovascular: Normal rate and regular rhythm.   Pulmonary/Chest: Effort normal. No respiratory distress. She has no wheezes. She has rhonchi (bilateral lower lung fields). She has no rales.  Abdominal: Soft. Bowel sounds are normal. She exhibits no distension. There is no tenderness.  Musculoskeletal: Normal range of motion.  Lymphadenopathy:    She has no cervical adenopathy.  Neurological: She is alert and oriented to person, place, and time.  Skin: Skin is warm and dry.  Psychiatric: She has a normal mood and affect. Her behavior is normal.   ED Treatments / Results  Labs (all labs ordered are listed, but only abnormal results are displayed) Labs Reviewed  RAPID [facility] SCREEN (NOT [facility]) - Abnormal; Notable for the following:       Result Value   Streptococcus, Group A Screen (Direct) POSITIVE (*)    All other components within normal limits    EKG  EKG Interpretation None       Radiology Dg Chest 2 View  Result Date: 02/08/2016 CLINICAL DATA:  complaining of constant, moderate, productive cough with green/yellow sputum x 3 days. She reports associated fever, chills, body aches, sore throat, rhinorrhea and mild bilateral anterior neck tenderness. EXAM: CHEST  2 VIEW COMPARISON:  07/14/2013 FINDINGS: The heart size and mediastinal contours are within normal limits. Both lungs are clear. No pleural effusion or pneumothorax. The visualized skeletal structures are unremarkable. IMPRESSION: Normal chest radiographs. Electronically Signed   By: Amie Portlandavid  Ormond M.D.   On: 02/08/2016 10:11    Procedures Procedures  DIAGNOSTIC STUDIES: Oxygen Saturation is 99% on RA, normal by my interpretation.     COORDINATION OF CARE: 9:30 AM Discussed next steps with pt. Pt verbalized understanding and is agreeable with the plan.    Medications Ordered in ED Medications  penicillin g benzathine (BICILLIN LA) 1200000 UNIT/2ML injection 1.2 Million Units (1.2 Million Units Intramuscular Given 02/08/16 1015)    Initial Impression / Assessment and Plan / ED Course  I have reviewed the triage vital signs and the nursing notes.  Pertinent labs & imaging results that were available during my care of the patient were reviewed by me and considered in my medical decision making (see chart for details).  Clinical Course     Pt rapid strep test positive. Pt is tolerating secretions. Presentation not concerning for peritonsillar abscess or spread of infection to deep spaces of the throat; patent airway. Pt will be discharged with tessalon perles and ibuprofen.  Specific return precautions discussed. Recommended PCP follow up. Pt appears safe for discharge.    Final Clinical Impressions(s) / ED  Diagnoses   Final diagnoses:  Strep pharyngitis  Cough    New Prescriptions New Prescriptions   BENZONATATE (TESSALON) 100 MG CAPSULE    Take 1 capsule (100 mg total) by mouth 3 (three) times daily as needed for cough.   IBUPROFEN (ADVIL,MOTRIN) 800 MG TABLET    Take 1 tablet (800 mg total) by mouth 3 (three) times daily.   I personally performed the services described in this documentation, which was scribed in my presence. The recorded information has been reviewed and is accurate.    Cheri FowlerKayla Hollan Philipp, PA-C 02/08/16 1047    Derwood KaplanAnkit Nanavati, MD 02/13/16 [phone]

## 2016-02-08 NOTE — ED Notes (Signed)
Patient sipping on sprite, tolerating well.

## 2016-02-08 NOTE — Discharge Instructions (Signed)
We treated you today for strep pharyngitis.  Take ibuprofen three times daily for pain.  Take Tessalon perles three times daily for cough.  Follow up with your primary care doctor in 1-2 days.  Return to the ED for sudden worsening pain, difficulty swallowing, drooling, or any new or concerning symptoms.

## 2016-02-08 NOTE — ED Triage Notes (Signed)
Pt states sore throat, cough, body aches since Friday afternoon.  Has been taking robitussin only.

## 2016-02-08 NOTE — ED Notes (Signed)
Declined W/C at D/C and was escorted to lobby by RN. 

## 2016-02-13 ENCOUNTER — Encounter (HOSPITAL_COMMUNITY): Payer: Self-pay | Admitting: *Deleted

## 2016-02-13 ENCOUNTER — Emergency Department (HOSPITAL_COMMUNITY)
Admission: EM | Admit: 2016-02-13 | Discharge: 2016-02-13 | Disposition: A | Discharge status: Home / Self Care | Payer: PRIVATE HEALTH INSURANCE | Attending: Emergency Medicine | Admitting: Emergency Medicine

## 2016-02-13 ENCOUNTER — Emergency Department (HOSPITAL_COMMUNITY): Payer: PRIVATE HEALTH INSURANCE

## 2016-02-13 DIAGNOSIS — I10 Essential (primary) hypertension: Secondary | ICD-10-CM | POA: Insufficient documentation

## 2016-02-13 DIAGNOSIS — J069 Acute upper respiratory infection, unspecified: Secondary | ICD-10-CM | POA: Insufficient documentation

## 2016-02-13 MED ORDER — GUAIFENESIN-CODEINE 100-10 MG/5ML PO SYRP: 5.0000 mL | ORAL_SOLUTION | Freq: Three times a day (TID) | ORAL | 0 refills | Status: AC | PRN

## 2016-02-13 MED ORDER — ACETAMINOPHEN 325 MG PO TABS
650.0000 mg | ORAL_TABLET | Freq: Once | ORAL | Status: AC
  Administered 2016-02-13: 650 mg via ORAL
  Filled 2016-02-13: qty 2

## 2016-02-13 NOTE — ED Provider Notes (Signed)
[facility] Provider Note   CSN: [id number] Arrival date & time: 02/13/16  1649     History   Chief Complaint Chief Complaint  Patient presents with  . Cough    HPI Misty Zuniga is a 25 y.o. female.  The history is provided by the patient and medical records. No language interpreter was used.  Cough  Associated symptoms include rhinorrhea. Pertinent negatives include no chills, no headaches, no sore throat, no shortness of breath and no wheezing.   Misty Zuniga is a 25 y.o. female  with a PMH of HTN who presents to the Emergency Department complaining of persistent productive cough and subjective fever x 1 week. Patient was seen on 11/27 where rapid strep was positive and she was treated with bicillin IM and rx for tessalon. Patient states sore throat and anterior neck pain resolved but cough and right sinus pain has persisted. Also endorses rhinorrhea. Husband with similar symptoms and diagnosed with bronchitis. She has been taking tessalon for cough with little relief. Ibuprofen as needed for fevers, but none today. Denies chest pain, shortness of breath, neck pain, sore throat toady.    Past Medical History:  Diagnosis Date  . Hypertension   . Medical history non-contributory   . SVD (spontaneous vaginal delivery) 07/15/2013    Patient Active Problem List   Diagnosis Date Noted  . SVD (spontaneous vaginal delivery) 07/15/2013  . Pre-eclampsia, mild, third trimester 07/09/2013    Past Surgical History:  Procedure Laterality Date  . 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 Authorizing Provider  acetaminophen (TYLENOL) 325 MG tablet Take 650 mg by mouth every 6 (six) hours as needed for headache.   Yes Historical  Provider, MD  benzonatate (TESSALON) 100 MG capsule Take 1 capsule (100 mg total) by mouth 3 (three) times daily as needed for cough. 02/08/16  Yes Kayla Rose, PA-C  guaifenesin (ROBITUSSIN) 100 MG/5ML syrup Take 200 mg by mouth 3 (three) times daily as needed for cough.   Yes Historical Provider, MD  ibuprofen (ADVIL,MOTRIN) 800 MG tablet Take 1 tablet (800 mg total) by mouth 3 (three) times daily. 02/08/16  Yes Kayla Rose, PA-C  guaiFENesin-codeine (ROBITUSSIN AC) 100-10 MG/5ML syrup Take 5 mLs by mouth 3 (three) times daily as needed for cough. 02/13/16   Chase Picket Corbin Hott, PA-C  oxyCODONE-acetaminophen (PERCOCET/ROXICET) 5-325 MG per tablet Take 1-2 tablets by mouth every 6 (six) hours as needed for severe pain (moderate - severe pain). 07/17/13   Sherian Rein, MD  Prenatal Vit-Fe Fumarate-FA (PRENATAL MULTIVITAMIN) TABS tablet Take 1 tablet by mouth daily at 12 noon. 07/17/13   Sherian Rein, MD    Family History Family History  Problem Relation Age of Onset  . Diabetes Mother   . Diabetes Maternal Aunt     Social History Social History  Substance Use Topics  . Smoking status: Never Smoker  . Smokeless tobacco: Never Used  . Alcohol use No     Allergies   Patient has no known allergies.   Review of Systems Review of Systems  Constitutional: Positive for  fever. Negative for chills.  HENT: Positive for congestion and rhinorrhea. Negative for sore throat and trouble swallowing.   Eyes: Negative for visual disturbance.  Respiratory: Positive for cough. Negative for shortness of breath and wheezing.   Cardiovascular: Negative.   Gastrointestinal: Negative for abdominal pain, nausea and vomiting.  Genitourinary: Negative for dysuria.  Musculoskeletal: Negative for back pain and neck pain.  Skin: Negative for rash.  Neurological: Negative for headaches.     Physical Exam Updated Vital Signs BP (!) 122/101   Pulse 99   Temp 100.2 F (37.9 C) (Rectal)   Resp  14   Ht 4\' 9"  (1.448 m)   Wt 85.4 kg   LMP 02/07/2014   SpO2 98%   BMI 40.75 kg/m   Physical Exam  Constitutional: She is oriented to person, place, and time. She appears well-developed and well-nourished. No distress.  HENT:  Head: Normocephalic and atraumatic.  OP with erythema, no exudates or tonsillar hypertrophy. + nasal congestion with mucosal edema. Focal area of sinus tenderness to right maxillary sinus.   Neck: Normal range of motion. Neck supple.  No meningeal signs.   Cardiovascular: Normal rate, regular rhythm and normal heart sounds.   Pulmonary/Chest: Effort normal.  Lungs are clear to auscultation bilaterally - no w/r/r  Abdominal: Soft. She exhibits no distension. There is no tenderness.  Musculoskeletal: Normal range of motion.  Neurological: She is alert and oriented to person, place, and time.  Skin: Skin is warm and dry. She is not diaphoretic.  Nursing note and vitals reviewed.    ED Treatments / Results  Labs (all labs ordered are listed, but only abnormal results are displayed) Labs Reviewed - No data to display  EKG  EKG Interpretation None       Radiology Dg Chest 2 View  Result Date: 02/13/2016 CLINICAL DATA:  Productive cough and fever for 1 week. EXAM: CHEST  2 VIEW COMPARISON:  02/08/2016. FINDINGS: Normal sized heart. Clear lungs. Minimal central peribronchial thickening without significant change. Minimal scoliosis. IMPRESSION: Minimal bronchitic changes. Electronically Signed   By: Beckie SaltsSteven  Reid M.D.   On: 02/13/2016 17:59    Procedures Procedures (including critical care time)  Medications Ordered in ED Medications  acetaminophen (TYLENOL) tablet 650 mg (650 mg Oral Given 02/13/16 1955)     Initial Impression / Assessment and Plan / ED Course  I have reviewed the triage vital signs and the nursing notes.  Pertinent labs & imaging results that were available during my care of the patient were reviewed by me and considered in my  medical decision making (see chart for details).  Clinical Course    Misty Zuniga is a 25 y.o. female who presents to ED for persistent productive cough and fever x 6 days. Seen in ED on 11/27 where rapid strep was positive and she was treated with bicillin. Sore throat, neck pain and body aches resolved but cough, right sinus pain and fevers persistent. On exam, patient is non-toxic appearing. OP with no exudates or signs of PTA. Lungs are CTA bilaterally. Patient was tachycardic initially  - oral rehydration in ER brought heart rate down to 99. CXR with no PNA and minimal bronchitic changes. Likely viral. Will give rx for cough suppressant to use at night. PCP follow up encouraged. Reasons to return to ER discussed and all questions answered.   Patient discussed with Dr. Ethelda ChickJacubowitz who agrees with treatment plan.   Final Clinical Impressions(s) / ED Diagnoses   Final diagnoses:  Upper  respiratory tract infection, unspecified type    New Prescriptions New Prescriptions   GUAIFENESIN-CODEINE (ROBITUSSIN AC) 100-10 MG/5ML SYRUP    Take 5 mLs by mouth 3 (three) times daily as needed for cough.     [facility] HospitalJaime Pilcher Alenah Sarria, PA-C 02/13/16 2018    Doug SouSam Jacubowitz, MD 02/13/16 [phone]

## 2016-02-13 NOTE — ED Notes (Signed)
Call for pt with no answer.

## 2016-02-13 NOTE — ED Triage Notes (Signed)
Pt reports having productive cough and fever x 1 week. No relief with otc meds. No resp distress noted at triage.

## 2016-02-13 NOTE — Discharge Instructions (Signed)
Increase hydration.  Cough syrup as needed for cough at night.  Follow up with your primary care provider for discussion of today's diagnosis.  Return to ER for new or worsening symptoms, any additional concerns.

## 2016-06-17 ENCOUNTER — Encounter (HOSPITAL_COMMUNITY): Payer: Self-pay | Admitting: Emergency Medicine

## 2016-06-17 ENCOUNTER — Emergency Department (HOSPITAL_COMMUNITY)
Admission: EM | Admit: 2016-06-17 | Discharge: 2016-06-17 | Disposition: A | Discharge status: Home / Self Care | Payer: PRIVATE HEALTH INSURANCE | Attending: Emergency Medicine | Admitting: Emergency Medicine

## 2016-06-17 DIAGNOSIS — J029 Acute pharyngitis, unspecified: Secondary | ICD-10-CM

## 2016-06-17 DIAGNOSIS — I10 Essential (primary) hypertension: Secondary | ICD-10-CM | POA: Insufficient documentation

## 2016-06-17 LAB — RAPID STREP SCREEN (MED CTR MEBANE ONLY): Streptococcus, Group A Screen (Direct): NEGATIVE

## 2016-06-17 MED ORDER — AMOXICILLIN 500 MG PO CAPS: 500.0000 mg | ORAL_CAPSULE | Freq: Three times a day (TID) | ORAL | 0 refills | Status: DC

## 2016-06-17 MED ORDER — ACETAMINOPHEN 325 MG PO TABS
650.0000 mg | ORAL_TABLET | Freq: Once | ORAL | Status: AC
  Administered 2016-06-17: 650 mg via ORAL
  Filled 2016-06-17: qty 2

## 2016-06-17 NOTE — ED Triage Notes (Signed)
Pt here for sore throat and fever x 3 days; pt sts hx of strep and feels same

## 2016-06-17 NOTE — ED Provider Notes (Signed)
[facility] Provider Note   CSN: [id number] Arrival date & time: 06/17/16  0804  History   Chief Complaint Chief Complaint  Patient presents with  . Sore Throat    HPI Misty Zuniga is a 26 y.o. female.  HPI   Patient to the ER with Medical history hypertension comes to the emergency department with complaints of sore throat for the past 3 days with fever (TMax 103) at home. In the ER she is febrile at 100F and tachycardic at 118, oxygen saturations 100% on room air. She reports having history of strep throat and this feels the same to her. The pain is bilateral. She has had decreased by mouth intake because of the pain. Not had any antipyretic this morning. Denies choking, stridor, inability to turn neck.  Past Medical History:  Diagnosis Date  . Hypertension   . Medical history non-contributory   . SVD (spontaneous vaginal delivery) 07/15/2013    Patient Active Problem List   Diagnosis Date Noted  . SVD (spontaneous vaginal delivery) 07/15/2013  . Pre-eclampsia, mild, third trimester 07/09/2013    Past Surgical History:  Procedure Laterality Date  . 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 Authorizing Provider  acetaminophen (TYLENOL) 325 MG tablet Take 650 mg by mouth every 6 (six) hours as needed for headache.    Historical Provider, MD  amoxicillin (AMOXIL) 500 MG capsule Take 1 capsule (500 mg total) by mouth 3 (three) times daily. 06/17/16   Billy Rocco Neva Seat, PA-C  benzonatate (TESSALON) 100 MG capsule Take 1 capsule (100 mg total) by mouth 3 (three) times daily as needed for cough. 02/08/16   Cheri Fowler, PA-C  guaifenesin (ROBITUSSIN) 100 MG/5ML syrup Take 200 mg by mouth 3 (three) times daily as needed for cough.     Historical Provider, MD  guaiFENesin-codeine (ROBITUSSIN AC) 100-10 MG/5ML syrup Take 5 mLs by mouth 3 (three) times daily as needed for cough. 02/13/16   Jaime Pilcher Ward, PA-C  ibuprofen (ADVIL,MOTRIN) 800 MG tablet Take 1 tablet (800 mg total) by mouth 3 (three) times daily. 02/08/16   Cheri Fowler, PA-C  oxyCODONE-acetaminophen (PERCOCET/ROXICET) 5-325 MG per tablet Take 1-2 tablets by mouth every 6 (six) hours as needed for severe pain (moderate - severe pain). 07/17/13   Sherian Rein, MD  Prenatal Vit-Fe Fumarate-FA (PRENATAL MULTIVITAMIN) TABS tablet Take 1 tablet by mouth daily at 12 noon. 07/17/13   Sherian Rein, MD    Family History Family History  Problem Relation Age of Onset  . Diabetes Mother   . Diabetes Maternal Aunt     Social History Social History  Substance Use Topics  . Smoking status: Never Smoker  . Smokeless tobacco: Never Used  . Alcohol use No     Allergies   Patient has no known allergies.   Review of Systems Review of Systems Review of Systems All other systems negative except as documented in the HPI. All pertinent positives and negatives as reviewed in the HPI.   Physical Exam Updated Vital Signs BP (!) 132/98   Pulse (!) 118   Temp  100 F (37.8 C) (Oral)   Resp 18   SpO2 100%   Physical Exam  Constitutional: She is oriented to person, place, and time. She appears well-developed and well-nourished. No distress.  HENT:  Head: Normocephalic and atraumatic.  Right Ear: Tympanic membrane, external ear and ear canal normal.  Left Ear: Tympanic membrane, external ear and ear canal normal.  Nose: Nose normal. No rhinorrhea. Right sinus exhibits no maxillary sinus tenderness and no frontal sinus tenderness. Left sinus exhibits no maxillary sinus tenderness and no frontal sinus tenderness.  Mouth/Throat: Uvula is midline and mucous membranes are normal. No trismus in the jaw. Normal dentition. No dental abscesses or uvula swelling.  Oropharyngeal exudate and posterior oropharyngeal edema present. No posterior oropharyngeal erythema or tonsillar abscesses.  No submental edema, tongue not elevated, no trismus. No impending airway obstruction; Pt able to speak full sentences, swallow intact, no drooling, stridor, or tonsillar/uvula displacement. No palatal petechia  Eyes: Conjunctivae are normal.  Neck: Trachea normal, normal range of motion and full passive range of motion without pain. Neck supple. No neck rigidity. Normal range of motion present. No Brudzinski's sign noted.  Flexion and extension of neck without pain or difficulty. Able to breath without difficulty in extension.  Cardiovascular: Normal rate and regular rhythm.   Pulmonary/Chest: Effort normal and breath sounds normal. No stridor. No respiratory distress. She has no wheezes.  Abdominal: Soft. There is no tenderness.  No obvious evidence of splenomegaly. Non ttp.   Musculoskeletal: Normal range of motion.  Lymphadenopathy:       Head (right side): No preauricular and no posterior auricular adenopathy present.       Head (left side): No preauricular and no posterior auricular adenopathy present.    She has cervical adenopathy.  Neurological: She is alert and oriented to person, place, and time.  Skin: Skin is warm and dry. No rash noted. She is not diaphoretic.  Psychiatric: She has a normal mood and affect.  Nursing note and vitals reviewed.    ED Treatments / Results  Labs (all labs ordered are listed, but only abnormal results are displayed) Labs Reviewed  RAPID STREP SCREEN (NOT AT [facility])  CULTURE, GROUP A STREP [facility])    EKG  EKG Interpretation None       Radiology No results found.  Procedures Procedures (including critical care time)  Medications Ordered in ED Medications  acetaminophen (TYLENOL) tablet 650 mg (650 mg Oral Given 06/17/16 0840)     Initial Impression / Assessment and Plan / ED Course  I have reviewed the triage  vital signs and the nursing notes.  Pertinent labs & imaging results that were available during my care of the patient were reviewed by me and considered in my medical decision making (see chart for details).    Strep screen is negative, however patient states that during specimen collection they reached the side of her cheek and not the back of her throat. She has had fever at home of 103 reportedly, 100 here, tachycardic at 115.  Encouraged to drink fluids, will send out throat culture and start on amoxicillin due to presentation.  Medications  acetaminophen (TYLENOL) tablet 650 mg (650 mg Oral Given 06/17/16 0840)    I discussed results, diagnoses and plan with Eli Phillips. They voice there understanding and questions were answered. We discussed follow-up recommendations and return precautions.    Final Clinical Impressions(s) / ED Diagnoses   Final diagnoses:  Pharyngitis, unspecified etiology    New Prescriptions  New Prescriptions   AMOXICILLIN (AMOXIL) 500 MG CAPSULE    Take 1 capsule (500 mg total) by mouth 3 (three) times daily.     Marlon Pel, PA-C 06/17/16 0914    Marlon Pel, PA-C 06/17/16 4098    Charlynne Pander, MD 06/17/16 [phone]

## 2016-06-19 LAB — CULTURE, GROUP A STREP (THRC)

## 2017-02-02 ENCOUNTER — Emergency Department (HOSPITAL_COMMUNITY)
Admission: EM | Admit: 2017-02-02 | Discharge: 2017-02-02 | Disposition: A | Discharge status: Home / Self Care | Payer: PRIVATE HEALTH INSURANCE | Attending: Emergency Medicine | Admitting: Emergency Medicine

## 2017-02-02 ENCOUNTER — Encounter (HOSPITAL_COMMUNITY): Payer: Self-pay

## 2017-02-02 ENCOUNTER — Other Ambulatory Visit: Payer: Self-pay

## 2017-02-02 DIAGNOSIS — Z79899 Other long term (current) drug therapy: Secondary | ICD-10-CM | POA: Insufficient documentation

## 2017-02-02 DIAGNOSIS — R0981 Nasal congestion: Secondary | ICD-10-CM | POA: Insufficient documentation

## 2017-02-02 DIAGNOSIS — B9789 Other viral agents as the cause of diseases classified elsewhere: Secondary | ICD-10-CM | POA: Insufficient documentation

## 2017-02-02 DIAGNOSIS — R05 Cough: Secondary | ICD-10-CM | POA: Insufficient documentation

## 2017-02-02 DIAGNOSIS — I10 Essential (primary) hypertension: Secondary | ICD-10-CM | POA: Insufficient documentation

## 2017-02-02 DIAGNOSIS — J069 Acute upper respiratory infection, unspecified: Secondary | ICD-10-CM

## 2017-02-02 MED ORDER — FLUTICASONE PROPIONATE 50 MCG/ACT NA SUSP: 2.0000 | Freq: Every day | NASAL | 0 refills | Status: AC

## 2017-02-02 MED ORDER — BENZONATATE 100 MG PO CAPS: 100.0000 mg | ORAL_CAPSULE | Freq: Three times a day (TID) | ORAL | 0 refills | Status: AC | PRN

## 2017-02-02 MED ORDER — BENZONATATE 100 MG PO CAPS: 100.0000 mg | ORAL_CAPSULE | Freq: Three times a day (TID) | ORAL | 0 refills | Status: AC

## 2017-02-02 NOTE — Discharge Instructions (Signed)
Take Tessalon for cough and Flonase for nasal congestion.  Drink plenty of fluids and get plenty of rest.  Continue using over-the-counter throat lozenges, warm teas and soups for your sore throat.  Take ibuprofen or Tylenol as needed for aches or pains.  Return to the ED immediately if any concerning signs or symptoms develop such as fevers, difficulty breathing or significant throat tightness.  Follow-up with a primary care physician if your symptoms persist greater than 1 week.

## 2017-02-02 NOTE — ED Triage Notes (Signed)
Pt c.o productive cough with green/yellowish mucous along with sore throat. Denies fever.

## 2017-02-02 NOTE — ED Provider Notes (Signed)
[facility] Provider Note   CSN: [id number] Arrival date & time: 02/02/17  1059     History   Chief Complaint Chief Complaint  Patient presents with  . Cough    HPI Misty Zuniga is a 26 y.o. female with history of HTN presents today with acute onset, progressively worsening nasal congestion and sore throat for 4 days. States her symptoms began Monday morning.  Associated symptoms include nonproductive cough.  Denies fevers, chills, chest pain, shortness of breath.  Denies drooling, facial swelling, or difficulty tolerating p.o. food or fluids.  She has tried cough drops with some relief.  Her children have the same symptoms and were recently seen and evaluated and were told they likely had a viral process.  The history is provided by the patient.  Cough  Associated symptoms include sore throat. Pertinent negatives include no chest pain, no chills and no ear pain.    Past Medical History:  Diagnosis Date  . Hypertension   . Medical history non-contributory   . SVD (spontaneous vaginal delivery) 07/15/2013    Patient Active Problem List   Diagnosis Date Noted  . SVD (spontaneous vaginal delivery) 07/15/2013  . Pre-eclampsia, mild, third trimester 07/09/2013    Past Surgical History:  Procedure Laterality Date  . 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 Authorizing Provider  acetaminophen (TYLENOL) 325 MG tablet Take 650 mg by mouth every 6 (six) hours as needed for headache.   Yes [provider]  guaifenesin (ROBITUSSIN) 100 MG/5ML syrup Take 200 mg by mouth 3 (three) times daily as needed for cough.   Yes [provider]  benzonatate (TESSALON) 100 MG capsule Take 1  capsule (100 mg total) by mouth every 8 (eight) hours. 02/02/17   Traeton Bordas A, PA-C  benzonatate (TESSALON) 100 MG capsule Take 1 capsule (100 mg total) by mouth 3 (three) times daily as needed for cough. 02/02/17   Taleigh Gero A, PA-C  fluticasone (FLONASE) 50 MCG/ACT nasal spray Place 2 sprays into both nostrils daily. 02/02/17   Talha Iser A, PA-C  guaiFENesin-codeine (ROBITUSSIN AC) 100-10 MG/5ML syrup Take 5 mLs by mouth 3 (three) times daily as needed for cough. Patient not taking: Reported on 02/02/2017 02/13/16   Ward, Chase PicketJaime Pilcher, PA-C  ibuprofen (ADVIL,MOTRIN) 800 MG tablet Take 1 tablet (800 mg total) by mouth 3 (three) times daily. Patient not taking: Reported on 02/02/2017 02/08/16   Cheri Fowlerose, Kayla, PA-C  oxyCODONE-acetaminophen (PERCOCET/ROXICET) 5-325 MG per tablet Take 1-2 tablets by mouth every 6 (six) hours as needed for severe pain (moderate - severe pain). Patient not taking: Reported on 02/02/2017 07/17/13   Sherian ReinBovard-Stuckert, Jody, MD  Prenatal Vit-Fe Fumarate-FA (PRENATAL MULTIVITAMIN) TABS tablet Take 1 tablet by mouth daily at 12 noon. Patient not taking: Reported on 02/02/2017 07/17/13   Sherian ReinBovard-Stuckert, Jody, MD    Family History Family History  Problem Relation Age of Onset  . Diabetes Mother   . Diabetes Maternal Aunt     Social History Social History   Tobacco Use  . Smoking status: Never  Smoker  . Smokeless tobacco: Never Used  Substance Use Topics  . Alcohol use: No  . Drug use: No     Allergies   Patient has no known allergies.   Review of Systems Review of Systems  Constitutional: Negative for chills and fever.  HENT: Positive for congestion and sore throat. Negative for drooling, ear discharge, ear pain and facial swelling.   Respiratory: Positive for cough.   Cardiovascular: Negative for chest pain.  Gastrointestinal: Negative for abdominal pain, nausea and vomiting.  All other systems reviewed and are negative.    Physical Exam Updated  Vital Signs BP 130/77 (BP Location: Right Arm)   Pulse 94   Temp 97.9 F (36.6 C) (Oral)   Resp 17   SpO2 100%   Physical Exam  Constitutional: She appears well-developed and well-nourished. No distress.  HENT:  Head: Normocephalic and atraumatic.  No frontal or maxillary sinus TTP, TMs normal bilaterally.  Nasal septum is midline with pink mucosa and mild mucosal edema bilaterally.  Posterior oropharynx with mild erythema and mild tonsillar hypertrophy, no exudates or uvular deviation.  Postnasal drip is noted.  No trismus or sublingual abnormalities noted.  Eyes: Conjunctivae and EOM are normal. Right eye exhibits no discharge. Left eye exhibits no discharge.  Neck: Normal range of motion. Neck supple. No JVD present. No tracheal deviation present.  Cardiovascular: Normal rate, regular rhythm and normal heart sounds.  Pulmonary/Chest: Effort normal and breath sounds normal. No stridor. No respiratory distress. She has no wheezes. She has no rales.  Abdominal: Soft. Bowel sounds are normal. She exhibits no distension. There is no tenderness.  Musculoskeletal: She exhibits no edema.  Lymphadenopathy:    She has no cervical adenopathy.  Neurological: She is alert.  Skin: Skin is warm and dry. No erythema.  Psychiatric: She has a normal mood and affect. Her behavior is normal.  Nursing note and vitals reviewed.    ED Treatments / Results  Labs (all labs ordered are listed, but only abnormal results are displayed) Labs Reviewed - No data to display  EKG  EKG Interpretation None       Radiology No results found.  Procedures Procedures (including critical care time)  Medications Ordered in ED Medications - No data to display   Initial Impression / Assessment and Plan / ED Course  I have reviewed the triage vital signs and the nursing notes.  Pertinent labs & imaging results that were available during my care of the patient were reviewed by me and considered in my  medical decision making (see chart for details).     Pt afebrile without tonsillar exudate. Presents with nonproductive cough, nasal congestion.  Likely viral URI with cough, sore throat likely from postnasal drip and irritation.  No abx indicated.  Will discharge with Tessalon for cough and Flonase for nasal congestion. Pt does not appear dehydrated, but did discuss importance of water rehydration. Presentation non concerning for PTA or infxn spread to soft tissue. No trismus or uvula deviation. Specific return precautions discussed. Pt able to drink water in ED without difficulty with intact air way. Recommended PCP follow up. Pt verbalized understanding of and agreement with plan and is safe for discharge home at this time.   Final Clinical Impressions(s) / ED Diagnoses   Final diagnoses:  Viral URI with cough    ED Discharge Orders        Ordered    benzonatate (TESSALON) 100 MG capsule  Every 8 hours  02/02/17 1135    benzonatate (TESSALON) 100 MG capsule  3 times daily PRN     02/02/17 1135    fluticasone (FLONASE) 50 MCG/ACT nasal spray  Daily     02/02/17 1135       Jeanie Sewer, PA-C 02/02/17 1136    Cathren Laine, MD 02/02/17 1237

## 2017-06-10 ENCOUNTER — Encounter (HOSPITAL_COMMUNITY): Payer: Self-pay | Admitting: Emergency Medicine

## 2017-06-10 ENCOUNTER — Other Ambulatory Visit: Payer: Self-pay

## 2017-06-10 ENCOUNTER — Emergency Department (HOSPITAL_COMMUNITY): Payer: Self-pay

## 2017-06-10 ENCOUNTER — Emergency Department (HOSPITAL_COMMUNITY)
Admission: EM | Admit: 2017-06-10 | Discharge: 2017-06-10 | Disposition: A | Discharge status: Home / Self Care | Payer: Self-pay | Attending: Emergency Medicine | Admitting: Emergency Medicine

## 2017-06-10 DIAGNOSIS — K529 Noninfective gastroenteritis and colitis, unspecified: Secondary | ICD-10-CM | POA: Insufficient documentation

## 2017-06-10 DIAGNOSIS — I10 Essential (primary) hypertension: Secondary | ICD-10-CM | POA: Insufficient documentation

## 2017-06-10 LAB — CBC
HCT: 47.9 % — ABNORMAL HIGH (ref 36.0–46.0)
HEMOGLOBIN: 15.9 g/dL — AB (ref 12.0–15.0)
MCH: 28.8 pg (ref 26.0–34.0)
MCHC: 33.2 g/dL (ref 30.0–36.0)
MCV: 86.8 fL (ref 78.0–100.0)
PLATELETS: 236 10*3/uL (ref 150–400)
RBC: 5.52 MIL/uL — AB (ref 3.87–5.11)
RDW: 13.6 % (ref 11.5–15.5)
WBC: 20.9 10*3/uL — AB (ref 4.0–10.5)

## 2017-06-10 LAB — LIPASE, BLOOD: Lipase: 25 U/L (ref 11–51)

## 2017-06-10 LAB — URINALYSIS, ROUTINE W REFLEX MICROSCOPIC
BILIRUBIN URINE: NEGATIVE
Bacteria, UA: NONE SEEN
Glucose, UA: NEGATIVE mg/dL
Ketones, ur: 5 mg/dL — AB
Nitrite: NEGATIVE
PH: 5 (ref 5.0–8.0)
Protein, ur: 100 mg/dL — AB
SPECIFIC GRAVITY, URINE: 1.031 — AB (ref 1.005–1.030)

## 2017-06-10 LAB — COMPREHENSIVE METABOLIC PANEL
ALT: 29 U/L (ref 14–54)
ANION GAP: 14 (ref 5–15)
AST: 25 U/L (ref 15–41)
Albumin: 4.1 g/dL (ref 3.5–5.0)
Alkaline Phosphatase: 81 U/L (ref 38–126)
BUN: 20 mg/dL (ref 6–20)
CHLORIDE: 103 mmol/L (ref 101–111)
CO2: 19 mmol/L — AB (ref 22–32)
CREATININE: 0.85 mg/dL (ref 0.44–1.00)
Calcium: 8.9 mg/dL (ref 8.9–10.3)
Glucose, Bld: 193 mg/dL — ABNORMAL HIGH (ref 65–99)
Potassium: 3.9 mmol/L (ref 3.5–5.1)
Sodium: 136 mmol/L (ref 135–145)
Total Bilirubin: 0.6 mg/dL (ref 0.3–1.2)
Total Protein: 8 g/dL (ref 6.5–8.1)

## 2017-06-10 LAB — I-STAT BETA HCG BLOOD, ED (MC, WL, AP ONLY): I-stat hCG, quantitative: <5 m[IU]/mL (ref <5)

## 2017-06-10 MED ORDER — SODIUM CHLORIDE 0.9 % IV BOLUS
2000.0000 mL | Freq: Once | INTRAVENOUS | Status: AC
  Administered 2017-06-10: 2000 mL via INTRAVENOUS

## 2017-06-10 MED ORDER — METOCLOPRAMIDE HCL 10 MG PO TABS: 10.0000 mg | ORAL_TABLET | Freq: Four times a day (QID) | ORAL | 0 refills | Status: AC | PRN

## 2017-06-10 MED ORDER — IOPAMIDOL (ISOVUE-300) INJECTION 61%
INTRAVENOUS | Status: AC
  Administered 2017-06-10: 100 mL
  Filled 2017-06-10: qty 100

## 2017-06-10 NOTE — ED Provider Notes (Addendum)
[facility] EMERGENCY DEPARTMENT Provider Note   CSN: [id number] Arrival date & time: 06/10/17  0457     History   Chief Complaint Chief Complaint  Patient presents with  . Diarrhea    HPI Misty Zuniga is a 27 y.o. female.  HPI Patient reports she has had 50 episodes of diarrhea since 8 PM yesterday and 8 episodes of vomiting.  She also complains of diffuse crampy intermittent abdominal pain.  Pain lasts approximately 5 minutes at a time.  Nothing makes symptoms better or worse.  She treated herself with Pepto-Bismol, without relief.  No recent antibiotics or travel.  No fever. other associated symptoms include mild lightheadedness.  No other associated symptoms.  She said that one episode of vomitus had a few drops of blood in it.  Otherwise no hematemesis Past Medical History:  Diagnosis Date  . Hypertension   . Medical history non-contributory   . SVD (spontaneous vaginal delivery) 07/15/2013    Patient Active Problem List   Diagnosis Date Noted  . SVD (spontaneous vaginal delivery) 07/15/2013  . Pre-eclampsia, mild, third trimester 07/09/2013    Past Surgical History:  Procedure Laterality Date  . 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 Authorizing Provider  acetaminophen (TYLENOL) 325 MG tablet Take 650 mg by mouth every 6 (six) hours as needed for headache.    [provider]  benzonatate (TESSALON) 100 MG capsule Take 1 capsule (100 mg total) by mouth every 8 (eight) hours. 02/02/17   Fawze, Mina A, PA-C  benzonatate (TESSALON) 100 MG capsule Take 1 capsule (100 mg total) by mouth 3 (three) times daily as needed for cough. 02/02/17    Fawze, Mina A, PA-C  fluticasone (FLONASE) 50 MCG/ACT nasal spray Place 2 sprays into both nostrils daily. 02/02/17   Fawze, Mina A, PA-C  guaifenesin (ROBITUSSIN) 100 MG/5ML syrup Take 200 mg by mouth 3 (three) times daily as needed for cough.    [provider]  guaiFENesin-codeine (ROBITUSSIN AC) 100-10 MG/5ML syrup Take 5 mLs by mouth 3 (three) times daily as needed for cough. Patient not taking: Reported on 02/02/2017 02/13/16   Ward, Chase Picket, PA-C  ibuprofen (ADVIL,MOTRIN) 800 MG tablet Take 1 tablet (800 mg total) by mouth 3 (three) times daily. Patient not taking: Reported on 02/02/2017 02/08/16   Cheri Fowler, PA-C  oxyCODONE-acetaminophen (PERCOCET/ROXICET) 5-325 MG per tablet Take 1-2 tablets by mouth every 6 (six) hours as needed for severe pain (moderate - severe pain). Patient not taking: Reported on 02/02/2017 07/17/13   Sherian Rein, MD  Prenatal Vit-Fe Fumarate-FA (PRENATAL MULTIVITAMIN) TABS tablet Take 1 tablet by mouth daily at 12 noon. Patient not taking: Reported on 02/02/2017 07/17/13   Sherian Rein, MD    Family History Family History  Problem Relation Age of Onset  . Diabetes Mother   . Diabetes Maternal Aunt  Social History Social History   Tobacco Use  . Smoking status: Never Smoker  . Smokeless tobacco: Never Used  Substance Use Topics  . Alcohol use: No  . Drug use: No     Allergies   Patient has no known allergies.   Review of Systems Review of Systems  Constitutional: Negative.   HENT: Negative.   Respiratory: Negative.   Cardiovascular: Negative.   Gastrointestinal: Positive for abdominal pain, diarrhea and vomiting.  Genitourinary:       Amenorrheic for the past 4 years since IUD in place  Musculoskeletal: Negative.   Skin: Negative.   Neurological: Negative.   Psychiatric/Behavioral: Negative.   All other systems reviewed and are negative.    Physical Exam Updated Vital Signs BP (!) 112/51 (BP  Location: Right Arm)   Pulse (!) 114   Temp 98.2 F (36.8 C) (Oral)   Resp 18   SpO2 97%   Physical Exam  Constitutional: She appears well-developed and well-nourished. No distress.  HENT:  Head: Normocephalic and atraumatic.  Eyes: Pupils are equal, round, and reactive to light. Conjunctivae are normal.  Neck: Neck supple. No tracheal deviation present. No thyromegaly present.  Cardiovascular: Regular rhythm.  No murmur heard. Mildly tachycardic  Pulmonary/Chest: Effort normal and breath sounds normal.  Abdominal: Soft. Bowel sounds are normal. She exhibits no distension. There is tenderness.  Morbidly obese minimal diffuse tenderness  Musculoskeletal: Normal range of motion. She exhibits no edema or tenderness.  Neurological: She is alert. Coordination normal.  Skin: Skin is warm and dry. No rash noted.  Psychiatric: She has a normal mood and affect.  Nursing note and vitals reviewed.    ED Treatments / Results  Labs (all labs ordered are listed, but only abnormal results are displayed) Labs Reviewed  COMPREHENSIVE METABOLIC PANEL - Abnormal; Notable for the following components:      Result Value   CO2 19 (*)    Glucose, Bld 193 (*)    All other components within normal limits  CBC - Abnormal; Notable for the following components:   WBC 20.9 (*)    RBC 5.52 (*)    Hemoglobin 15.9 (*)    HCT 47.9 (*)    All other components within normal limits  URINALYSIS, ROUTINE W REFLEX MICROSCOPIC - Abnormal; Notable for the following components:   APPearance CLOUDY (*)    Specific Gravity, Urine 1.031 (*)    Hgb urine dipstick MODERATE (*)    Ketones, ur 5 (*)    Protein, ur 100 (*)    Leukocytes, UA SMALL (*)    Squamous Epithelial / LPF TOO NUMEROUS TO COUNT (*)    All other components within normal limits  LIPASE, BLOOD  I-STAT BETA HCG BLOOD, ED ([facility], [facility], [facility] ONLY)    EKG None  Radiology No results found.  Procedures Procedures (including critical care  time)  Medications Ordered in ED Medications - No data to display  Results for orders placed or performed during the hospital encounter of 06/10/17  Lipase, blood  Result Value Ref Range   Lipase 25 11 - 51 U/L  Comprehensive metabolic panel  Result Value Ref Range   Sodium 136 135 - 145 mmol/L   Potassium 3.9 3.5 - 5.1 mmol/L   Chloride 103 101 - 111 mmol/L   CO2 19 (L) 22 - 32 mmol/L   Glucose, Bld 193 (H) 65 - 99 mg/dL   BUN 20 6 - 20 mg/dL   Creatinine, Ser 4.09 0.44 -  1.00 mg/dL   Calcium 8.9 8.9 - 16.110.3 mg/dL   Total Protein 8.0 6.5 - 8.1 g/dL   Albumin 4.1 3.5 - 5.0 g/dL   AST 25 15 - 41 U/L   ALT 29 14 - 54 U/L   Alkaline Phosphatase 81 38 - 126 U/L   Total Bilirubin 0.6 0.3 - 1.2 mg/dL   GFR calc non Af Amer >60 >60 mL/min   GFR calc Af Amer >60 >60 mL/min   Anion gap 14 5 - 15  CBC  Result Value Ref Range   WBC 20.9 (H) 4.0 - 10.5 K/uL   RBC 5.52 (H) 3.87 - 5.11 MIL/uL   Hemoglobin 15.9 (H) 12.0 - 15.0 g/dL   HCT 09.647.9 (H) 04.536.0 - 40.946.0 %   MCV 86.8 78.0 - 100.0 fL   MCH 28.8 26.0 - 34.0 pg   MCHC 33.2 30.0 - 36.0 g/dL   RDW 81.113.6 91.411.5 - 78.215.5 %   Platelets 236 150 - 400 K/uL  Urinalysis, Routine w reflex microscopic  Result Value Ref Range   Color, Urine YELLOW YELLOW   APPearance CLOUDY (A) CLEAR   Specific Gravity, Urine 1.031 (H) 1.005 - 1.030   pH 5.0 5.0 - 8.0   Glucose, UA NEGATIVE NEGATIVE mg/dL   Hgb urine dipstick MODERATE (A) NEGATIVE   Bilirubin Urine NEGATIVE NEGATIVE   Ketones, ur 5 (A) NEGATIVE mg/dL   Protein, ur 956100 (A) NEGATIVE mg/dL   Nitrite NEGATIVE NEGATIVE   Leukocytes, UA SMALL (A) NEGATIVE   RBC / HPF 6-30 0 - 5 RBC/hpf   WBC, UA 6-30 0 - 5 WBC/hpf   Bacteria, UA NONE SEEN NONE SEEN   Squamous Epithelial / LPF TOO NUMEROUS TO COUNT (A) NONE SEEN   Mucus PRESENT    Hyaline Casts, UA PRESENT   I-Stat beta hCG blood, ED  Result Value Ref Range   I-stat hCG, quantitative <5.0 <5 mIU/mL   Comment 3           Ct Abdomen Pelvis W  Contrast  Result Date: 06/10/2017 CLINICAL DATA:  Lower abdominal pain. EXAM: CT ABDOMEN AND PELVIS WITH CONTRAST TECHNIQUE: Multidetector CT imaging of the abdomen and pelvis was performed using the standard protocol following bolus administration of intravenous contrast. CONTRAST:  100mL ISOVUE-300 IOPAMIDOL (ISOVUE-300) INJECTION 61% COMPARISON:  None. FINDINGS: Lower chest: No acute abnormality. Hepatobiliary: No focal liver abnormality is seen. No gallstones, gallbladder wall thickening, or biliary dilatation. Pancreas: Unremarkable. No pancreatic ductal dilatation or surrounding inflammatory changes. Spleen: Normal in size without focal abnormality. Adrenals/Urinary Tract: The adrenal glands appear normal. Unremarkable appearance of both kidneys. The urinary bladder appears normal. Stomach/Bowel: The stomach is normal. The small bowel loops have a normal course and caliber. There is mild mucosal enhancement involving the distal small bowel with mild wall thickening. Intramural fatty deposition of the cecum is also identified suggestive of chronic inflammation. No fat stranding or free fluid noted. No focal fluid collections noted. No pathologic dilatation of the colon. Vascular/Lymphatic: Normal appearance of the abdominal aorta. No enlarged retroperitoneal or mesenteric adenopathy. No enlarged pelvic or inguinal lymph nodes. Reproductive: IUD noted within the uterine cavity. Other: No free fluid or fluid collections. Musculoskeletal: Dysplastic appearing left femoral head is identified with secondary degenerative changes. IMPRESSION: 1. Mild mucosal enhancement and mild wall thickening involving distal small bowel loops noted. Findings may reflect sequelae of inflammatory or infectious enteritis. No complicating features identified. There is also mild intramural fatty deposition involving the cecum which  may be seen with chronic inflammation. Clinical correlation for any clinical signs or symptoms of  inflammatory bowel disease is recommended. 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 Electronically Signed   By: Signa Kell M.D.   On: 06/10/2017 11:40   Initial Impression / Assessment and Plan / ED Course  I have reviewed the triage vital signs and the nursing notes.  Pertinent labs & imaging results that were available during my care of the patient were reviewed by me and considered in my medical decision making (see chart for details).     12:10 PM patient is alert no distress feels well ready to go home.  After treatment with intravenous hydration able to drink water without nausea or vomiting.  Abdominal pain has resolved.  Plan prescription Reglan.  Imodium for diarrhea.  Avoid dairy.  Encourage oral hydration.  Referral primary care.  Lab work remarkable for leukocytosis.   marketed leukocytosis not correlate clinically with patient's illnesses patient is not clinically very ill Clinically patient has gastroenteritis. Final Clinical Impressions(s) / ED Diagnoses  Diagnosis # gastroenteritis Final diagnoses:  None    ED Discharge Orders    None       Doug Sou, MD 06/10/17 1220    Doug Sou, MD 06/10/17 1221

## 2017-06-10 NOTE — Discharge Instructions (Signed)
Make sure that you drink at least six 8 ounce glasses of water or Gatorade each day in order to stay well-hydrated.  Take Imodium as directed for diarrhea.  Avoid milk or foods containing milk such as cheese or ice cream while having diarrhea.  Take the medication prescribed as needed for nausea.  Call the number on these instructions to get primary care physician and to be seen if not feeling better by next week.  Return for lightheadedness fainting or if your condition worsens for any reason

## 2017-06-10 NOTE — ED Triage Notes (Signed)
Pt reports lower ad pain, emesis and diarrhea since yesterday. States the last time she vomited "some red stuff, but not much." Cramps in lower abdomen, groin.

## 2018-09-15 IMAGING — CT CT ABD-PELV W/ CM
2 of 4 series · 16 of 46 positions shown, 18 images · IV contrast (iopamidol)
Comparison: None.

CLINICAL DATA: Lower abdominal pain.

EXAM:
CT ABDOMEN AND PELVIS WITH CONTRAST
TECHNIQUE: Multidetector CT imaging of the abdomen and pelvis was performed
using the standard protocol following bolus administration of
intravenous contrast.
CONTRAST:  100mL 8PSD42-N33 IOPAMIDOL (8PSD42-N33) INJECTION 61%

[Series 3: abdomen 5.0 · axial · 0.94mm/px · z∈[+904,+1269]mm · 13 of 83 slices shown, 15 images]
[im 5/83  soft-tissue]
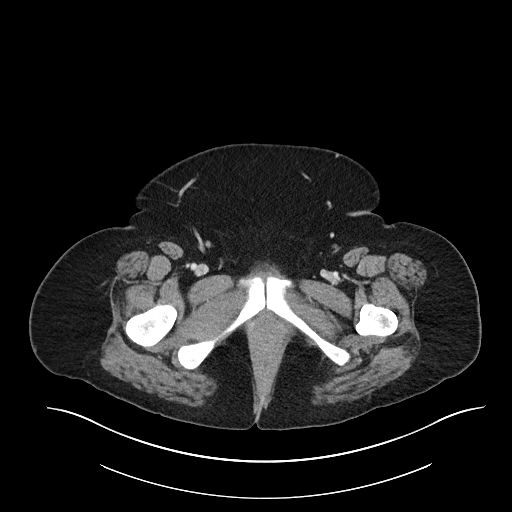
[im 5/83  bone]
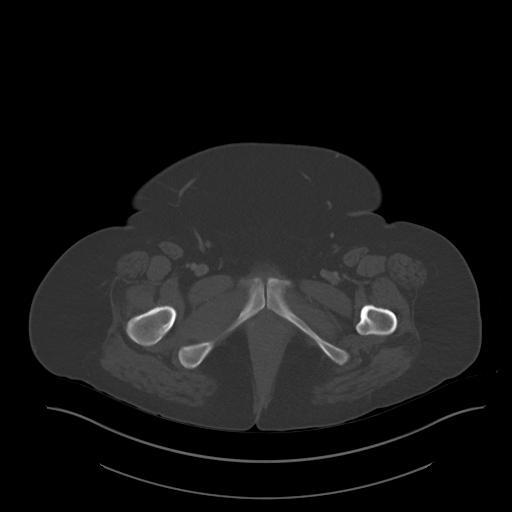
[im 13/83  soft-tissue]
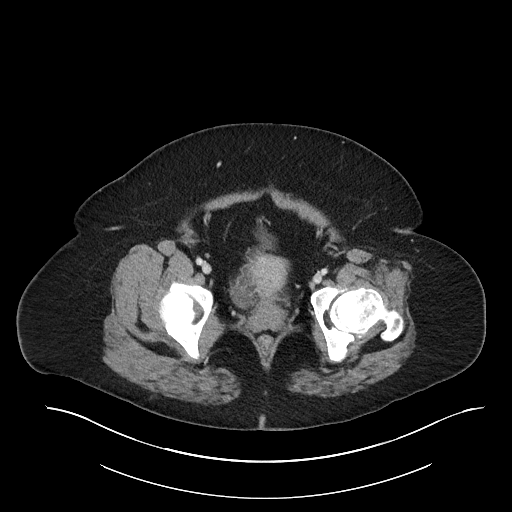
[im 18/83  soft-tissue]
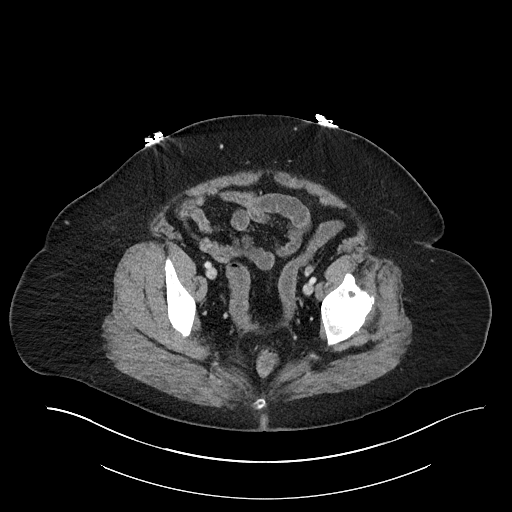
[im 22/83  soft-tissue]
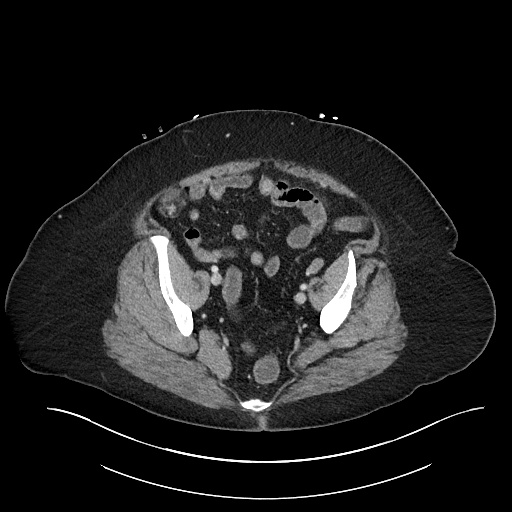
[im 31/83  soft-tissue]
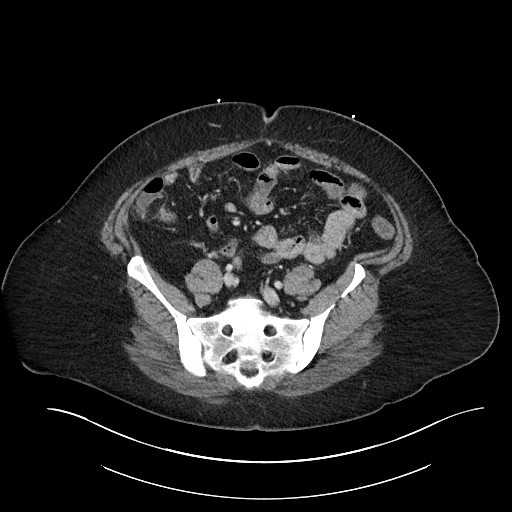
[im 35/83  soft-tissue]
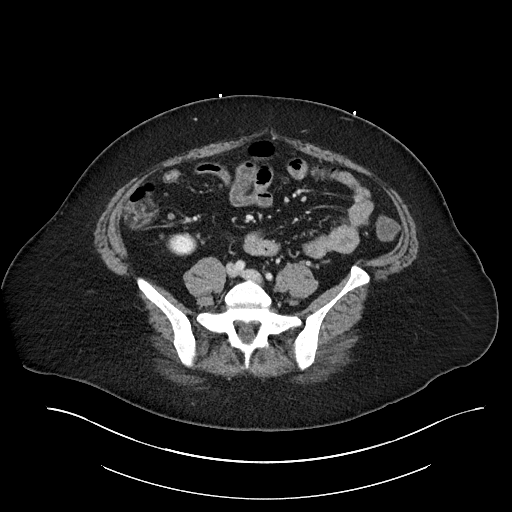
[im 44/83  soft-tissue]
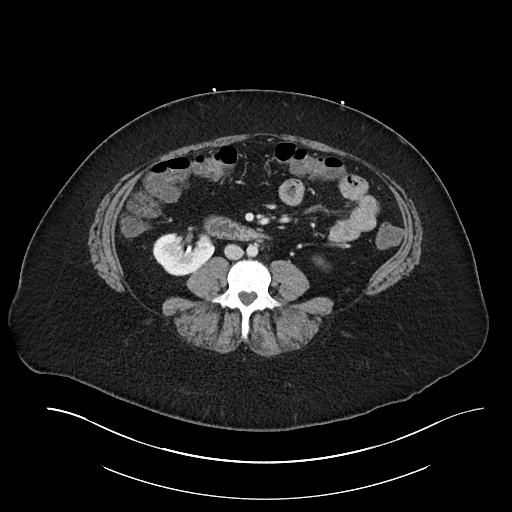
[im 48/83  soft-tissue]
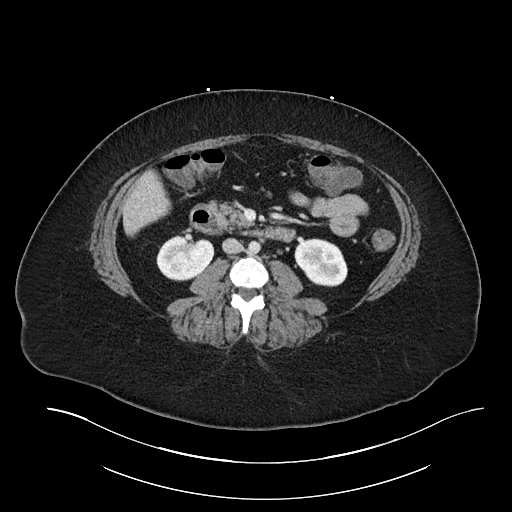
[im 52/83  soft-tissue]
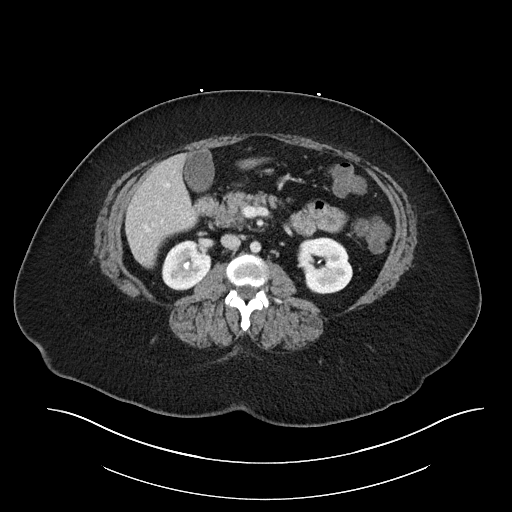
[im 52/83  bone]
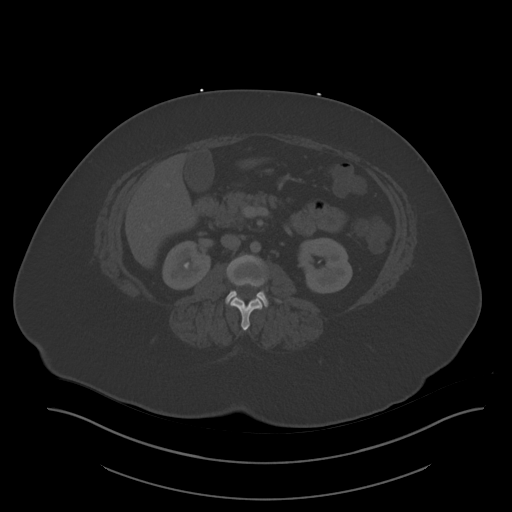
[im 61/83  soft-tissue]
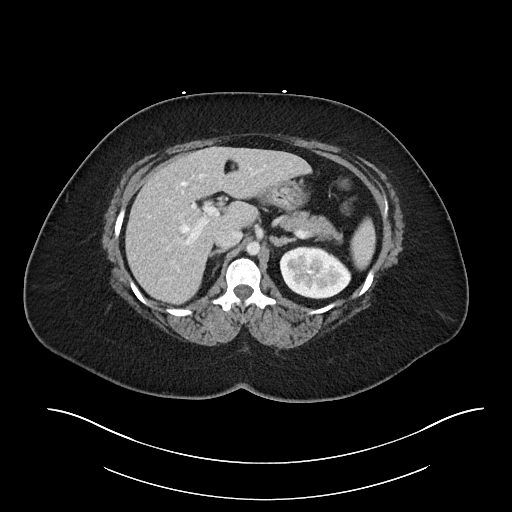
[im 65/83  soft-tissue]
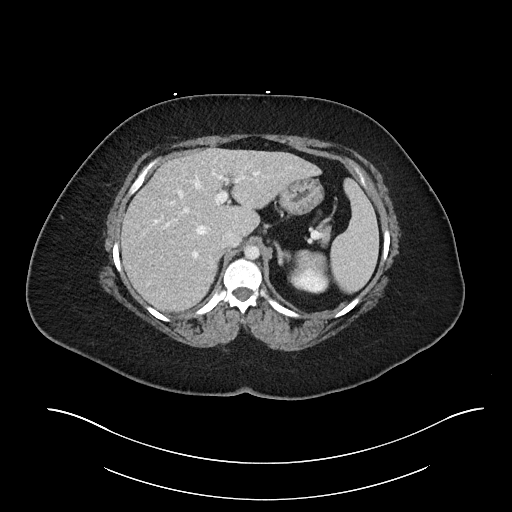
[im 70/83  soft-tissue]
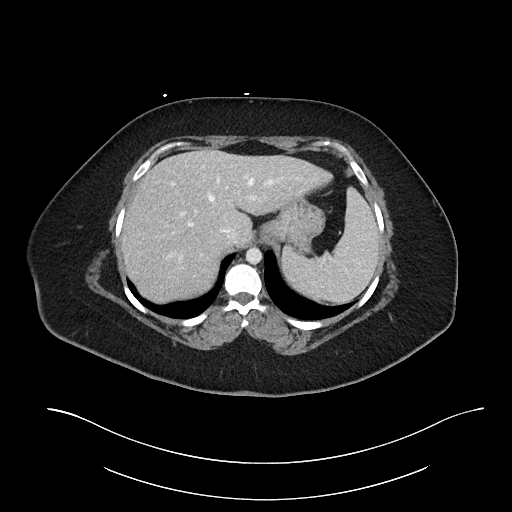
[im 78/83  soft-tissue]
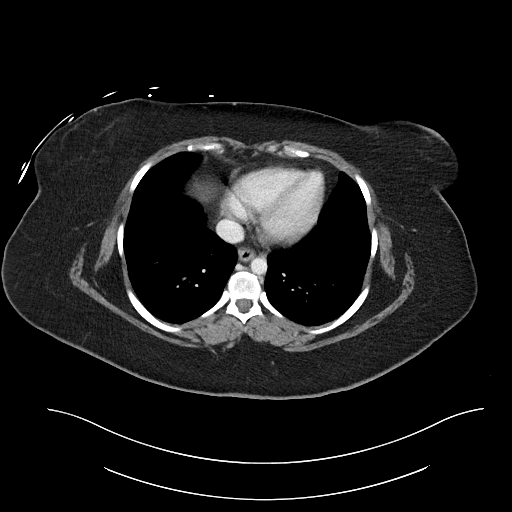

[Series 6: abdomen 3.0 mpr cor · coronal · 0.70mm/px · 3 of 86 slices shown]
[im 29/86  soft-tissue]
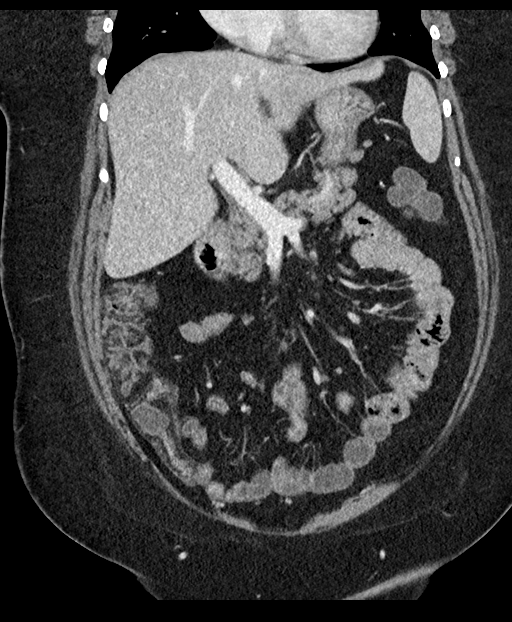
[im 38/86  soft-tissue]
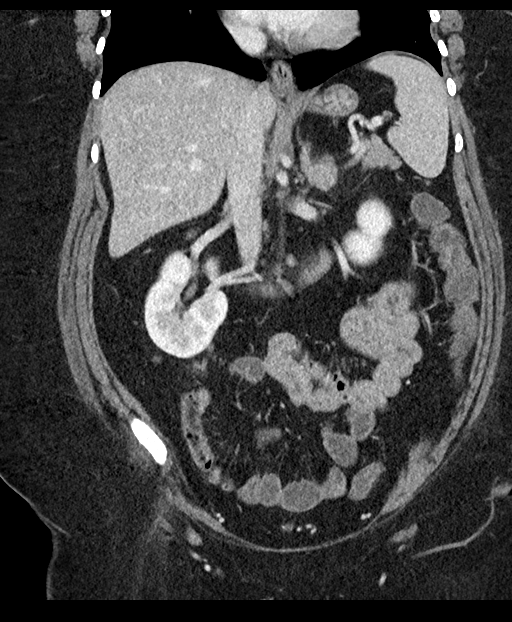
[im 48/86  soft-tissue]
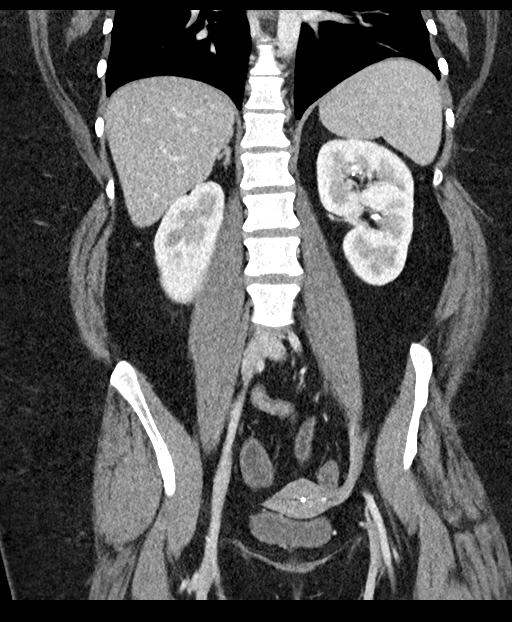

[16 of 46 positions shown; findings below may reference images not displayed]

FINDINGS: Lower chest: No acute abnormality.

Hepatobiliary: No focal liver abnormality is seen. No gallstones,
gallbladder wall thickening, or biliary dilatation.

Pancreas: Unremarkable. No pancreatic ductal dilatation or
surrounding inflammatory changes.

Spleen: Normal in size without focal abnormality.

Adrenals/Urinary Tract: The adrenal glands appear normal.
Unremarkable appearance of both kidneys. The urinary bladder appears
normal.

Stomach/Bowel: The stomach is normal. The small bowel loops have a
normal course and caliber. There is mild mucosal enhancement
involving the distal small bowel with mild wall thickening.
Intramural fatty deposition of the cecum is also identified
suggestive of chronic inflammation. No fat stranding or free fluid
noted. No focal fluid collections noted. No pathologic dilatation of
the colon.

Vascular/Lymphatic: Normal appearance of the abdominal aorta. No
enlarged retroperitoneal or mesenteric adenopathy. No enlarged
pelvic or inguinal lymph nodes.

Reproductive: IUD noted within the uterine cavity.

Other: No free fluid or fluid collections.

Musculoskeletal: Dysplastic appearing left femoral head is
identified with secondary degenerative changes.
IMPRESSION: 1. Mild mucosal enhancement and mild wall thickening involving
distal small bowel loops noted. Findings may reflect sequelae of
inflammatory or infectious enteritis. No complicating features
identified. There is also mild intramural fatty deposition involving
the cecum which may be seen with chronic inflammation. Clinical
correlation for any clinical signs or symptoms of inflammatory bowel
disease is recommended.
2. Left hip dysplasia with secondary degenerative changes.

## 2023-06-07 ENCOUNTER — Other Ambulatory Visit: Payer: Self-pay

## 2023-06-07 ENCOUNTER — Encounter (HOSPITAL_COMMUNITY): Payer: Self-pay | Admitting: *Deleted

## 2023-06-07 ENCOUNTER — Ambulatory Visit (HOSPITAL_COMMUNITY)
Admission: EM | Admit: 2023-06-07 | Discharge: 2023-06-07 | Disposition: A | Discharge status: Home / Self Care | Payer: Self-pay | Attending: Sports Medicine | Admitting: Sports Medicine

## 2023-06-07 DIAGNOSIS — J029 Acute pharyngitis, unspecified: Secondary | ICD-10-CM

## 2023-06-07 LAB — POCT RAPID STREP A (OFFICE): Rapid Strep A Screen: NEGATIVE

## 2023-06-07 NOTE — Discharge Instructions (Addendum)
 You have been diagnosed with viral pharyngitis, a common condition characterized by a sore throat caused by a viral infection. This condition is typically self-limiting and resolves within a week without the need for antibiotics.  Symptom Management:  Pain Relief: Over-the-counter analgesics such as acetaminophen or ibuprofen can help alleviate throat pain and reduce fever.  Topical Treatments: Throat lozenges, salt water gargles, and viscous lidocaine may provide additional pain relief, although data on their effectiveness are limited.  Hydration: Drink plenty of fluids to stay hydrated and soothe your throat.  Rest: Ensure adequate rest to support your immune system in fighting the infection.  When to Seek Further Medical Attention:  If you develop difficulty breathing, severe pain, or a high fever that does not respond to medication.  If symptoms persist beyond 10 days or worsen significantly.  If you experience new symptoms such as a rash, swollen glands, or difficulty swallowing.

## 2023-06-07 NOTE — ED Provider Notes (Signed)
 [facility]    CSN: [id number] Arrival date & time: 06/07/23  0802      History   Chief Complaint Chief Complaint  Patient presents with   Sore Throat   Fever    HPI Misty Zuniga is a 33 y.o. female.   Misty Zuniga is a 33yo female here with 5 days of sore throat. She also endorses fever, mild headache, cough, nasal congestion. Denies dyspnea, chest pain, abdominal pain, N/V. She has not tried any medications for this. Is concerned she may have strep throat.   Sore Throat Pertinent negatives include no chest pain and no shortness of breath.  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 Authorizing Provider  acetaminophen (TYLENOL) 325 MG tablet Take 650 mg by mouth every 6 (six) hours as needed for headache.    [provider]  benzonatate (TESSALON) 100 MG capsule Take 1 capsule (100 mg total) by mouth every 8 (eight) hours. 02/02/17   Fawze, Mina A, PA-C  benzonatate (TESSALON) 100 MG capsule Take 1 capsule (100 mg total) by mouth 3 (three) times daily as needed for cough. 02/02/17   Fawze, Mina A, PA-C  fluticasone (FLONASE) 50 MCG/ACT nasal spray Place 2 sprays into both nostrils daily. 02/02/17   Fawze, Mina  A, PA-C  guaifenesin (ROBITUSSIN) 100 MG/5ML syrup Take 200 mg by mouth 3 (three) times daily as needed for cough.    [provider]  guaiFENesin-codeine (ROBITUSSIN AC) 100-10 MG/5ML syrup Take 5 mLs by mouth 3 (three) times daily as needed for cough. Patient not taking: Reported on 02/02/2017 02/13/16   Ward, Chase Picket, PA-C  ibuprofen (ADVIL,MOTRIN) 800 MG tablet Take 1 tablet (800 mg total) by mouth 3 (three) times daily. Patient not taking: Reported on 02/02/2017 02/08/16   Cheri Fowler, PA-C  metoCLOPramide (REGLAN) 10 MG tablet Take 1 tablet (10 mg total) by mouth every 6 (six) hours as needed for nausea (nausea/headache). 06/10/17   Doug Sou, MD  oxyCODONE-acetaminophen (PERCOCET/ROXICET) 5-325 MG per tablet Take 1-2 tablets by mouth every 6 (six) hours as needed for severe pain (moderate - severe pain). Patient not taking: Reported on 02/02/2017 07/17/13   Sherian Rein, MD  Prenatal Vit-Fe Fumarate-FA (PRENATAL MULTIVITAMIN) TABS tablet Take 1 tablet by mouth daily at 12 noon. Patient not taking: Reported on 02/02/2017 07/17/13   Sherian Rein, MD    Family History Family History  Problem Relation  Age of Onset   Diabetes Mother    Diabetes Maternal Aunt     Social History Social History   Tobacco Use   Smoking status: Never   Smokeless tobacco: Never  Substance Use Topics   Alcohol use: No   Drug use: No     Allergies   Patient has no known allergies.   Review of Systems Review of Systems  Constitutional:  Positive for fever.  HENT:  Positive for congestion, postnasal drip, sore throat and trouble swallowing. Negative for sinus pressure and sinus pain.   Respiratory:  Positive for cough. Negative for chest tightness, shortness of breath and wheezing.   Cardiovascular:  Negative for chest pain.     Physical Exam Triage Vital Signs ED Triage Vitals  Encounter Vitals Group     BP 06/07/23 0818 (!) 138/90     Systolic BP Percentile  --      Diastolic BP Percentile --      Pulse Rate 06/07/23 0818 (!) 103     Resp 06/07/23 0818 20     Temp 06/07/23 0818 99.7 F (37.6 C)     Temp src --      SpO2 06/07/23 0818 97 %     Weight --      Height --      Head Circumference --      Peak Flow --      Pain Score 06/07/23 0816 6     Pain Loc --      Pain Education --      Exclude from Growth Chart --    No data found.  Updated Vital Signs BP (!) 138/90   Pulse (!) 103   Temp 99.7 F (37.6 C)   Resp 20   LMP 06/03/2023   SpO2 97%   Visual Acuity Right Eye Distance:   Left Eye Distance:   Bilateral Distance:    Right Eye Near:   Left Eye Near:    Bilateral Near:     Physical Exam Constitutional:      General: She is not in acute distress.    Appearance: She is well-developed. She is obese. She is not ill-appearing or toxic-appearing.  HENT:     Head: Normocephalic and atraumatic.     Right Ear: Tympanic membrane and ear canal normal.     Left Ear: Tympanic membrane and ear canal normal.     Nose: No congestion.     Mouth/Throat:     Mouth: Mucous membranes are moist. No oral lesions.     Pharynx: Uvula midline. Pharyngeal swelling and posterior oropharyngeal erythema present. No oropharyngeal exudate or uvula swelling.     Tonsils: No tonsillar exudate or tonsillar abscesses. 1+ on the right. 1+ on the left.  Eyes:     Extraocular Movements:     Right eye: Normal extraocular motion.     Left eye: Normal extraocular motion.     Conjunctiva/sclera: Conjunctivae normal.     Pupils: Pupils are equal, round, and reactive to light.  Cardiovascular:     Rate and Rhythm: Normal rate and regular rhythm.     Heart sounds: Normal heart sounds.  Pulmonary:     Effort: Pulmonary effort is normal. No respiratory distress.     Breath sounds: Normal breath sounds.  Musculoskeletal:     Cervical back: Normal range of motion and neck supple.  Lymphadenopathy:     Cervical: No cervical adenopathy.  Skin:     Capillary Refill: Capillary  refill takes less than 2 seconds.  Neurological:     General: No focal deficit present.     Mental Status: She is alert and oriented to person, place, and time.  Psychiatric:        Mood and Affect: Mood normal.        Behavior: Behavior normal.      UC Treatments / Results  Labs (all labs ordered are listed, but only abnormal results are displayed) Labs Reviewed  POCT RAPID STREP A (OFFICE)    EKG   Radiology No results found.  Procedures Procedures (including critical care time)  Medications Ordered in UC Medications - No data to display  Initial Impression / Assessment and Plan / UC Course  I have reviewed the triage vital signs and the nursing notes.  Pertinent labs & imaging results that were available during my care of the patient were reviewed by me and considered in my medical decision making (see chart for details).   Vitals and triage reviewed, patient is hemodynamically stable.   Viral pharyngitis Overall, vitals and exam are reassuring. Strep rapid antigen negative. No red flags Supportive care discussed. Return and ER precautions discussed Patient's questions were answered and they are in agreement with this plan  Final Clinical Impressions(s) / UC Diagnoses   Final diagnoses:  Viral pharyngitis     Discharge Instructions      You have been diagnosed with viral pharyngitis, a common condition characterized by a sore throat caused by a viral infection. This condition is typically self-limiting and resolves within a week without the need for antibiotics.  Symptom Management:  Pain Relief: Over-the-counter analgesics such as acetaminophen or ibuprofen can help alleviate throat pain and reduce fever.  Topical Treatments: Throat lozenges, salt water gargles, and viscous lidocaine may provide additional pain relief, although data on their effectiveness are limited.  Hydration: Drink plenty of fluids to stay hydrated and  soothe your throat.  Rest: Ensure adequate rest to support your immune system in fighting the infection.  When to Seek Further Medical Attention:  If you develop difficulty breathing, severe pain, or a high fever that does not respond to medication.  If symptoms persist beyond 10 days or worsen significantly.  If you experience new symptoms such as a rash, swollen glands, or difficulty swallowing.   ED Prescriptions   None    PDMP not reviewed this encounter.   Marisa Cyphers, MD 06/07/23 [phone]

## 2023-06-07 NOTE — ED Notes (Signed)
 Reviewed over the counter medicines as outlined in discharge instructions

## 2023-06-07 NOTE — ED Triage Notes (Signed)
 PT reports a sore throat and fever since SAt. Pt reports she has Strep frequently.
